# Patient Record
Sex: Female | Born: 1970 | Race: White | Hispanic: No | Marital: Married | Smoking: Former smoker
Health system: Southern US, Community
[De-identification: ages and names within clinical notes are randomized; demographics above are authoritative.]

## PROBLEM LIST (undated history)

## (undated) DIAGNOSIS — Z803 Family history of malignant neoplasm of breast: Secondary | ICD-10-CM

## (undated) DIAGNOSIS — Z800 Family history of malignant neoplasm of digestive organs: Secondary | ICD-10-CM

## (undated) DIAGNOSIS — E079 Disorder of thyroid, unspecified: Secondary | ICD-10-CM

## (undated) DIAGNOSIS — Z808 Family history of malignant neoplasm of other organs or systems: Secondary | ICD-10-CM

## (undated) DIAGNOSIS — C50919 Malignant neoplasm of unspecified site of unspecified female breast: Secondary | ICD-10-CM

## (undated) DIAGNOSIS — I10 Essential (primary) hypertension: Secondary | ICD-10-CM

## (undated) DIAGNOSIS — T7840XA Allergy, unspecified, initial encounter: Secondary | ICD-10-CM

## (undated) DIAGNOSIS — E569 Vitamin deficiency, unspecified: Secondary | ICD-10-CM

## (undated) HISTORY — DX: Disorder of thyroid, unspecified: E07.9

## (undated) HISTORY — DX: Family history of malignant neoplasm of other organs or systems: Z80.8

## (undated) HISTORY — PX: WISDOM TOOTH EXTRACTION: SHX21

## (undated) HISTORY — DX: Allergy, unspecified, initial encounter: T78.40XA

## (undated) HISTORY — DX: Malignant neoplasm of unspecified site of unspecified female breast: C50.919

## (undated) HISTORY — DX: Essential (primary) hypertension: I10

## (undated) HISTORY — DX: Family history of malignant neoplasm of digestive organs: Z80.0

## (undated) HISTORY — DX: Vitamin deficiency, unspecified: E56.9

## (undated) HISTORY — DX: Family history of malignant neoplasm of breast: Z80.3

---

## 2001-03-01 ENCOUNTER — Other Ambulatory Visit: Admission: RE | Admit: 2001-03-01 | Discharge: 2001-03-01 | Payer: Self-pay | Admitting: Obstetrics and Gynecology

## 2001-10-02 ENCOUNTER — Inpatient Hospital Stay (HOSPITAL_COMMUNITY): Admission: AD | Admit: 2001-10-02 | Discharge: 2001-10-04 | Payer: Self-pay | Admitting: Obstetrics and Gynecology

## 2001-11-07 ENCOUNTER — Other Ambulatory Visit: Admission: RE | Admit: 2001-11-07 | Discharge: 2001-11-07 | Payer: Self-pay | Admitting: Obstetrics and Gynecology

## 2003-02-04 ENCOUNTER — Other Ambulatory Visit: Admission: RE | Admit: 2003-02-04 | Discharge: 2003-02-04 | Payer: Self-pay | Admitting: Obstetrics and Gynecology

## 2003-11-18 ENCOUNTER — Inpatient Hospital Stay (HOSPITAL_COMMUNITY): Admission: AD | Admit: 2003-11-18 | Discharge: 2003-11-18 | Payer: Self-pay | Admitting: Obstetrics and Gynecology

## 2003-12-10 ENCOUNTER — Inpatient Hospital Stay (HOSPITAL_COMMUNITY): Admission: AD | Admit: 2003-12-10 | Discharge: 2003-12-12 | Payer: Self-pay | Admitting: Obstetrics and Gynecology

## 2004-03-07 ENCOUNTER — Other Ambulatory Visit: Admission: RE | Admit: 2004-03-07 | Discharge: 2004-03-07 | Payer: Self-pay | Admitting: Obstetrics and Gynecology

## 2004-07-08 ENCOUNTER — Encounter: Admission: RE | Admit: 2004-07-08 | Discharge: 2004-07-08 | Payer: Self-pay | Admitting: *Deleted

## 2005-08-17 ENCOUNTER — Encounter: Admission: RE | Admit: 2005-08-17 | Discharge: 2005-08-17 | Payer: Self-pay | Admitting: Obstetrics and Gynecology

## 2010-10-27 ENCOUNTER — Encounter: Admission: RE | Admit: 2010-10-27 | Discharge: 2010-10-27 | Payer: Self-pay | Admitting: Internal Medicine

## 2010-11-09 ENCOUNTER — Encounter
Admission: RE | Admit: 2010-11-09 | Discharge: 2010-11-09 | Payer: Self-pay | Source: Home / Self Care | Attending: Internal Medicine | Admitting: Internal Medicine

## 2011-04-14 NOTE — H&P (Signed)
NAME:  Crystal Harrison, Crystal Harrison                        ACCOUNT NO.:  [id number]   MEDICAL RECORD NO.:  [id number]                   PATIENT TYPE:  INP   LOCATION:  9163                                 FACILITY:  [facility]   PHYSICIAN:  Lenoard Aden, M.D.             DATE OF BIRTH:  [birth year]   DATE OF ADMISSION:  12/10/2003  DATE OF DISCHARGE:                                HISTORY & PHYSICAL   CHIEF COMPLAINT:  Labor.   HISTORY OF PRESENT ILLNESS:  This is a 40 year old white female, G3, P1, EDD  December 07, 2003, at 40-4/7ths weeks, in active labor.   PAST OBSTETRICAL HISTORY:  1. Past obstetrical0 history is remarkable for SAB times one.  2. SVD in 2002.   ALLERGIES:  No known drug allergies.   MEDICATIONS:  Synthroid, albuterol and prenatal vitamins.   PAST MEDICAL HISTORY:  Past medical history is significant for asthma and  hypothyroidism.   FAMILY HISTORY:  Family history of mitral valve prolapse and heart disease.   PRENATAL LABORATORY DATA:  Blood type B positive, Rh antibody negative.  Rubella immune.  Hepatitis and HIV negative.   PRENATAL COURSE:  The prenatal course was complicated by size-dates  discrepancy with borderline oligohydramnios, otherwise uncomplicated labor  course.   PHYSICAL EXAMINATION:  GENERAL APPEARANCE:  On physical exam she is a well-  developed, well-nourished white female in no acute distress.  HEENT:  Normal.  LUNGS:  Lungs are clear.  HEART:  Regular rhythm.  ABDOMEN:  Abdomen is soft, gravid and nontender.  Estimated fetal weight 7.5  pounds.  PELVIC EXAMINATION:  Cervix is 8 cm, 100%, vertex and +1.  EXTREMITIES:  There are no cords.  NEUROLOGIC:  Neurologic exam is nonfocal.   IMPRESSION:  Term intrauterine pregnancy in active labor.   PLAN:  Artificial rupture of membranes and anticipate vaginal delivery.                                               Lenoard Aden, M.D.    RJT/MEDQ  D:  12/11/2003  T:  12/11/2003  Job:   [id number]

## 2011-04-14 NOTE — H&P (Signed)
[facility]  Patient:    Crystal Harrison, Crystal Harrison Visit Number: [id number] MRN: [id number]          Service Type: OBS Location: 910A [id number] [id number] Attending Physician:  Esmeralda Arthur Dictated by:   Silverio Lay, M.D. Admit Date:  10/02/2001                           History and Physical  REASON FOR ADMISSION:         Intrauterine pregnancy at 39 weeks and 6 days in active labor.  HISTORY OF PRESENT ILLNESS:   This is a 40 year old married white female, gravida 2, para 0, aborta 1, with a due date of October 03, 2001, by ultrasound, who presented in the office reporting irregular uterine contractions all morning, but of intensity increasing.  Since 2 p.m. this afternoon, contractions have been every three to five minutes, intensity 5 on 10, and she reports some bloody show, but no watery discharge.  She reports good fetal activity and denies any pregnancy-induced hypertension.  Prenatal course revealed blood type B-positive.  RPR nonreactive.  Rubella immune.  HBsAg negative.  HIV nonreactive.  Pap smear within normal limits. Gonorrhea negative.  Chlamydia negative.  First trimester ultrasound provided [place] with a due date of October 03, 2001.  A 16 week AFP was within normal limits.  A 21 week ultrasound revealed a normal anatomy survey with an anterior placenta, normal cervical length.  Ultrasound at 23 weeks for second trimester bleeding revealed a normal placenta.  A 28 week glucose tolerance test was within normal limits.  A 28 week ultrasound revealed an average for gestational age with normal amniotic fluid index.  A 35 week group B strep was negative.  Her prenatal course was otherwise remarkable for first trimester diagnosis of goiter which was seen in consultation by Dr. Criss Alvine, and the patient was found to have Graves disease.  She is currently followed by Dr. Evlyn Kanner, and is currently using PTU 50 mg alternating with 25 mg every day. She was seen at  [facility] for consultation with no particular recommendation, but to follow fetal growth.  Her prenatal course was otherwise uneventful.  ALLERGIES:                    No known drug allergies.  PAST MEDICAL HISTORY:         December 2001, spontaneous miscarriage with no complications.  FAMILY HISTORY:               Father with coronary artery disease.  Mother with arrhythmia.  SOCIAL HISTORY:               Married, nonsmoker, is a Runner, broadcasting/film/video.  PHYSICAL EXAMINATION:  GENERAL:                      No acute distress.  VITAL SIGNS:                  Normal.  HEENT:                        Negative.  LUNGS:                        Clear.  HEART:  Normal.  ABDOMEN:                      Gravid, nontender, vertex presentation.  VAGINAL EXAMINATION:          Four to five centimeters, completely effaced, bulging bag of membranes.  EXTREMITIES:                  Negative.  LABORATORY DATA:              Fetal heart rate tracing reactive.  ASSESSMENT:                   1. Intrauterine pregnancy at 39 weeks and 6 days                                  in active labor.                               2. Graves disease, well-controlled, followed by                                  Dr. Evlyn Kanner.  Currently on PTU 50/25 mg                                  alternating.  PLAN:                         The patient will be admitted to labor and delivery.  Spontaneous vaginal delivery is expected. Dictated by:   Silverio Lay, M.D. Attending Physician:  Esmeralda Arthur DD:  10/02/01 TD:  10/02/01 Job: [id number]

## 2011-10-23 ENCOUNTER — Other Ambulatory Visit: Payer: Self-pay | Admitting: Obstetrics and Gynecology

## 2011-10-23 DIAGNOSIS — Z1231 Encounter for screening mammogram for malignant neoplasm of breast: Secondary | ICD-10-CM

## 2011-11-24 ENCOUNTER — Ambulatory Visit: Payer: Self-pay

## 2011-12-14 ENCOUNTER — Ambulatory Visit
Admission: RE | Admit: 2011-12-14 | Discharge: 2011-12-14 | Disposition: A | Discharge status: Home / Self Care | Payer: BC Managed Care – PPO | Source: Ambulatory Visit | Attending: Obstetrics and Gynecology | Admitting: Obstetrics and Gynecology

## 2011-12-14 DIAGNOSIS — Z1231 Encounter for screening mammogram for malignant neoplasm of breast: Secondary | ICD-10-CM

## 2012-07-23 ENCOUNTER — Encounter (INDEPENDENT_AMBULATORY_CARE_PROVIDER_SITE_OTHER): Payer: Self-pay | Admitting: General Surgery

## 2012-07-24 ENCOUNTER — Ambulatory Visit (INDEPENDENT_AMBULATORY_CARE_PROVIDER_SITE_OTHER): Payer: BC Managed Care – PPO | Admitting: General Surgery

## 2012-07-24 ENCOUNTER — Encounter (INDEPENDENT_AMBULATORY_CARE_PROVIDER_SITE_OTHER): Payer: Self-pay | Admitting: General Surgery

## 2012-07-24 VITALS — BP 108/68 | HR 64 | Temp 97.6°F | Resp 16 | Ht 66.5 in | Wt 147.8 lb

## 2012-07-24 DIAGNOSIS — K644 Residual hemorrhoidal skin tags: Secondary | ICD-10-CM

## 2012-07-24 NOTE — Addendum Note (Signed)
Addended by: Liz Malady on: 07/24/2012 10:47 AM   Modules accepted: Orders

## 2012-07-24 NOTE — Progress Notes (Signed)
Patient ID: Crystal Harrison, female   DOB: 1971-09-05, 41 y.o.   MRN: [id number]  Chief Complaint  Patient presents with  . Other    new pt- eval skin tag    HPI Crystal Harrison is a 41 y.o. female.   HPIPatient presents for evaluation of anal skin tag. I was asked to see her in consultation by Dr. Donette Larry. She has had hemorrhoid problems for the past 12 months. They have improved significantly with changes in her diet. She did, however, noticed a polyp or a skin tag in her anal area after resolution of her hemorrhoid symptoms. This was noted by her gynecologist in followup up with an examination by her primary care physician. She is here for consideration for removal. She's had no localized pain since her hemorrhoid symptoms resolved.  Past Medical History  Diagnosis Date  . Hypertension   . Thyroid disease   . Allergy   . Vitamin deficiency     History reviewed. No pertinent past surgical history.  Family History  Problem Relation Age of Onset  . Heart disease Mother   . Heart disease Father   . Cancer Maternal Aunt     breast  . Cancer Paternal Aunt     thyroid  . Cancer Paternal Grandmother     stomach    Social History History  Substance Use Topics  . Smoking status: Former Smoker    Quit date: 07/24/2003  . Smokeless tobacco: Not on file  . Alcohol Use: Yes    No Known Allergies  Current Outpatient Prescriptions  Medication Sig Dispense Refill  . albuterol (PROVENTIL HFA;VENTOLIN HFA) 108 (90 BASE) MCG/ACT inhaler Inhale 2 puffs into the lungs every 6 (six) hours as needed.      . cholecalciferol (VITAMIN D) 1000 UNITS tablet Take 1,000 Units by mouth daily.      . hydrochlorothiazide (HYDRODIURIL) 25 MG tablet Take 25 mg by mouth daily.      Marland Kitchen levothyroxine (SYNTHROID, LEVOTHROID) 75 MCG tablet Take 75 mcg by mouth daily.      . montelukast (SINGULAIR) 10 MG tablet Take 10 mg by mouth as needed.       . ramipril (ALTACE) 5 MG tablet Take 5 mg by mouth daily.         Review of Systems Review of Systems  Constitutional: Negative for fever, chills and unexpected weight change.  HENT: Negative for hearing loss, congestion, sore throat, trouble swallowing and voice change.   Eyes: Negative for visual disturbance.  Respiratory: Negative for cough and wheezing.   Cardiovascular: Negative for chest pain, palpitations and leg swelling.  Gastrointestinal: Negative for nausea, vomiting, abdominal pain, diarrhea, constipation, blood in stool, abdominal distention and anal bleeding.       See history of present illness  Genitourinary: Negative for hematuria, vaginal bleeding and difficulty urinating.  Musculoskeletal: Negative for arthralgias.  Skin: Negative for rash and wound.  Neurological: Negative for seizures, syncope and headaches.  Hematological: Negative for adenopathy. Does not bruise/bleed easily.  Psychiatric/Behavioral: Negative for confusion.    Blood pressure 108/68, pulse 64, temperature 97.6 F (36.4 C), temperature source Temporal, resp. rate 16, height 5' 6.5" (1.689 m), weight 147 lb 12.8 oz (67.042 kg).  Physical Exam Physical Exam  Constitutional: She is oriented to person, place, and time. She appears well-developed and well-nourished. No distress.  HENT:  Head: Normocephalic and atraumatic.  Eyes: Conjunctivae and EOM are normal. Pupils are equal, round, and reactive to light.  Neck:  Normal range of motion. Neck supple. No tracheal deviation present. No thyromegaly present.  Cardiovascular: Normal rate, regular rhythm, normal heart sounds and intact distal pulses.   Pulmonary/Chest: Effort normal and breath sounds normal. No stridor. No respiratory distress. She has no wheezes. She has no rales.  Abdominal: Soft. Bowel sounds are normal. She exhibits no distension. There is no tenderness. There is no rebound and no guarding.       External anal exam reveals a large skin tag in the posterior right side. It is pedunculated. Digital  rectal exam reveals no masses. There is no evidence of infection.  Musculoskeletal: Normal range of motion.  Neurological: She is alert and oriented to person, place, and time.  Skin: Skin is warm.    Data Reviewed   Assessment    External anal skin tag    Plan    This is very symptomatic to her. It bothers her at times and causes her discomfort with hygiene. I have offered to remove it. Procedure of excision anal skin tag was described in detail. We discussed the risks and benefits. She is agreeable. She will check her schedule and call back to pick a time.       Sheryl Saintil E 07/24/2012, 10:40 AM

## 2012-11-28 ENCOUNTER — Other Ambulatory Visit: Payer: Self-pay | Admitting: Obstetrics and Gynecology

## 2012-11-28 DIAGNOSIS — Z1231 Encounter for screening mammogram for malignant neoplasm of breast: Secondary | ICD-10-CM

## 2012-12-30 ENCOUNTER — Ambulatory Visit
Admission: RE | Admit: 2012-12-30 | Discharge: 2012-12-30 | Disposition: A | Discharge status: Home / Self Care | Payer: BC Managed Care – PPO | Source: Ambulatory Visit | Attending: Obstetrics and Gynecology | Admitting: Obstetrics and Gynecology

## 2012-12-30 DIAGNOSIS — Z1231 Encounter for screening mammogram for malignant neoplasm of breast: Secondary | ICD-10-CM

## 2013-12-15 ENCOUNTER — Other Ambulatory Visit: Payer: Self-pay

## 2013-12-15 DIAGNOSIS — Z1231 Encounter for screening mammogram for malignant neoplasm of breast: Secondary | ICD-10-CM

## 2014-01-06 ENCOUNTER — Ambulatory Visit
Admission: RE | Admit: 2014-01-06 | Discharge: 2014-01-06 | Disposition: A | Discharge status: Home / Self Care | Payer: BC Managed Care – PPO | Source: Ambulatory Visit

## 2014-01-06 DIAGNOSIS — Z1231 Encounter for screening mammogram for malignant neoplasm of breast: Secondary | ICD-10-CM

## 2015-07-13 ENCOUNTER — Other Ambulatory Visit: Payer: Self-pay

## 2015-07-13 DIAGNOSIS — Z1231 Encounter for screening mammogram for malignant neoplasm of breast: Secondary | ICD-10-CM

## 2015-08-23 ENCOUNTER — Ambulatory Visit
Admission: RE | Admit: 2015-08-23 | Discharge: 2015-08-23 | Disposition: A | Discharge status: Home / Self Care | Payer: Self-pay | Source: Ambulatory Visit

## 2015-08-23 DIAGNOSIS — Z1231 Encounter for screening mammogram for malignant neoplasm of breast: Secondary | ICD-10-CM

## 2015-08-26 ENCOUNTER — Other Ambulatory Visit: Payer: Self-pay | Admitting: Obstetrics and Gynecology

## 2015-08-26 DIAGNOSIS — R928 Other abnormal and inconclusive findings on diagnostic imaging of breast: Secondary | ICD-10-CM

## 2015-08-31 ENCOUNTER — Other Ambulatory Visit: Payer: Self-pay | Admitting: Internal Medicine

## 2015-08-31 ENCOUNTER — Other Ambulatory Visit: Payer: Self-pay | Admitting: General Surgery

## 2015-08-31 DIAGNOSIS — R928 Other abnormal and inconclusive findings on diagnostic imaging of breast: Secondary | ICD-10-CM

## 2015-09-01 ENCOUNTER — Ambulatory Visit
Admission: RE | Admit: 2015-09-01 | Discharge: 2015-09-01 | Disposition: A | Discharge status: Home / Self Care | Payer: BLUE CROSS/BLUE SHIELD | Source: Ambulatory Visit | Attending: Obstetrics and Gynecology | Admitting: Obstetrics and Gynecology

## 2015-09-01 DIAGNOSIS — R928 Other abnormal and inconclusive findings on diagnostic imaging of breast: Secondary | ICD-10-CM

## 2016-03-01 DIAGNOSIS — D259 Leiomyoma of uterus, unspecified: Secondary | ICD-10-CM | POA: Diagnosis not present

## 2016-03-01 DIAGNOSIS — Z30432 Encounter for removal of intrauterine contraceptive device: Secondary | ICD-10-CM | POA: Diagnosis not present

## 2016-12-20 ENCOUNTER — Other Ambulatory Visit: Payer: Self-pay | Admitting: Internal Medicine

## 2016-12-20 DIAGNOSIS — E559 Vitamin D deficiency, unspecified: Secondary | ICD-10-CM | POA: Diagnosis not present

## 2016-12-20 DIAGNOSIS — Z1231 Encounter for screening mammogram for malignant neoplasm of breast: Secondary | ICD-10-CM

## 2016-12-20 DIAGNOSIS — Z23 Encounter for immunization: Secondary | ICD-10-CM | POA: Diagnosis not present

## 2016-12-20 DIAGNOSIS — Z0000 Encounter for general adult medical examination without abnormal findings: Secondary | ICD-10-CM | POA: Diagnosis not present

## 2016-12-20 DIAGNOSIS — E039 Hypothyroidism, unspecified: Secondary | ICD-10-CM | POA: Diagnosis not present

## 2016-12-20 DIAGNOSIS — Z1389 Encounter for screening for other disorder: Secondary | ICD-10-CM | POA: Diagnosis not present

## 2017-01-15 ENCOUNTER — Ambulatory Visit
Admission: RE | Admit: 2017-01-15 | Discharge: 2017-01-15 | Disposition: A | Discharge status: Home / Self Care | Payer: BLUE CROSS/BLUE SHIELD | Source: Ambulatory Visit | Attending: Internal Medicine | Admitting: Internal Medicine

## 2017-01-15 DIAGNOSIS — Z1231 Encounter for screening mammogram for malignant neoplasm of breast: Secondary | ICD-10-CM | POA: Diagnosis not present

## 2017-02-01 DIAGNOSIS — E038 Other specified hypothyroidism: Secondary | ICD-10-CM | POA: Diagnosis not present

## 2018-01-21 DIAGNOSIS — Z0000 Encounter for general adult medical examination without abnormal findings: Secondary | ICD-10-CM | POA: Diagnosis not present

## 2018-01-21 DIAGNOSIS — E559 Vitamin D deficiency, unspecified: Secondary | ICD-10-CM | POA: Diagnosis not present

## 2018-01-21 DIAGNOSIS — Z1389 Encounter for screening for other disorder: Secondary | ICD-10-CM | POA: Diagnosis not present

## 2018-01-21 DIAGNOSIS — E039 Hypothyroidism, unspecified: Secondary | ICD-10-CM | POA: Diagnosis not present

## 2018-01-21 DIAGNOSIS — Z136 Encounter for screening for cardiovascular disorders: Secondary | ICD-10-CM | POA: Diagnosis not present

## 2018-03-01 ENCOUNTER — Other Ambulatory Visit: Payer: Self-pay | Admitting: Internal Medicine

## 2018-03-01 DIAGNOSIS — Z1231 Encounter for screening mammogram for malignant neoplasm of breast: Secondary | ICD-10-CM

## 2018-03-26 ENCOUNTER — Ambulatory Visit
Admission: RE | Admit: 2018-03-26 | Discharge: 2018-03-26 | Disposition: A | Discharge status: Home / Self Care | Payer: BLUE CROSS/BLUE SHIELD | Source: Ambulatory Visit | Attending: Internal Medicine | Admitting: Internal Medicine

## 2018-03-26 DIAGNOSIS — Z1231 Encounter for screening mammogram for malignant neoplasm of breast: Secondary | ICD-10-CM

## 2018-03-28 ENCOUNTER — Other Ambulatory Visit: Payer: Self-pay | Admitting: Internal Medicine

## 2018-03-28 DIAGNOSIS — R928 Other abnormal and inconclusive findings on diagnostic imaging of breast: Secondary | ICD-10-CM

## 2018-04-01 ENCOUNTER — Ambulatory Visit
Admission: RE | Admit: 2018-04-01 | Discharge: 2018-04-01 | Disposition: A | Discharge status: Home / Self Care | Payer: BLUE CROSS/BLUE SHIELD | Source: Ambulatory Visit | Attending: Internal Medicine | Admitting: Internal Medicine

## 2018-04-01 ENCOUNTER — Other Ambulatory Visit: Payer: Self-pay | Admitting: Internal Medicine

## 2018-04-01 DIAGNOSIS — R921 Mammographic calcification found on diagnostic imaging of breast: Secondary | ICD-10-CM | POA: Diagnosis not present

## 2018-04-01 DIAGNOSIS — R928 Other abnormal and inconclusive findings on diagnostic imaging of breast: Secondary | ICD-10-CM

## 2018-04-05 ENCOUNTER — Ambulatory Visit
Admission: RE | Admit: 2018-04-05 | Discharge: 2018-04-05 | Disposition: A | Discharge status: Home / Self Care | Payer: BLUE CROSS/BLUE SHIELD | Source: Ambulatory Visit | Attending: Internal Medicine | Admitting: Internal Medicine

## 2018-04-05 DIAGNOSIS — R921 Mammographic calcification found on diagnostic imaging of breast: Secondary | ICD-10-CM

## 2018-04-05 DIAGNOSIS — D0511 Intraductal carcinoma in situ of right breast: Secondary | ICD-10-CM | POA: Diagnosis not present

## 2018-04-11 ENCOUNTER — Other Ambulatory Visit: Payer: Self-pay | Admitting: Surgery

## 2018-04-11 DIAGNOSIS — D0511 Intraductal carcinoma in situ of right breast: Secondary | ICD-10-CM

## 2018-04-13 ENCOUNTER — Other Ambulatory Visit: Payer: Self-pay | Admitting: Surgery

## 2018-04-13 DIAGNOSIS — D493 Neoplasm of unspecified behavior of breast: Secondary | ICD-10-CM

## 2018-04-15 ENCOUNTER — Telehealth: Payer: Self-pay | Admitting: Oncology

## 2018-04-15 ENCOUNTER — Encounter: Payer: Self-pay | Admitting: Radiation Oncology

## 2018-04-15 DIAGNOSIS — R921 Mammographic calcification found on diagnostic imaging of breast: Secondary | ICD-10-CM | POA: Diagnosis not present

## 2018-04-15 DIAGNOSIS — N6489 Other specified disorders of breast: Secondary | ICD-10-CM | POA: Diagnosis not present

## 2018-04-15 DIAGNOSIS — D0511 Intraductal carcinoma in situ of right breast: Secondary | ICD-10-CM | POA: Diagnosis not present

## 2018-04-15 DIAGNOSIS — D0591 Unspecified type of carcinoma in situ of right breast: Secondary | ICD-10-CM | POA: Diagnosis not present

## 2018-04-15 NOTE — Telephone Encounter (Signed)
Pt has been scheduled to see Dr. Jana Hakim on 5/23 at 4pm. Pt aware to arrive 30 minutes for labs. Ms.Nghiem has been scheduled to see Roma Kayser on 5/22 at 11am for genetic counseling as well.

## 2018-04-16 DIAGNOSIS — D0511 Intraductal carcinoma in situ of right breast: Secondary | ICD-10-CM | POA: Diagnosis not present

## 2018-04-17 ENCOUNTER — Encounter: Payer: Self-pay | Admitting: Genetics

## 2018-04-17 ENCOUNTER — Inpatient Hospital Stay: Payer: BLUE CROSS/BLUE SHIELD

## 2018-04-17 ENCOUNTER — Other Ambulatory Visit: Payer: Self-pay

## 2018-04-17 ENCOUNTER — Inpatient Hospital Stay: Payer: BLUE CROSS/BLUE SHIELD | Attending: Genetic Counselor | Admitting: Genetics

## 2018-04-17 DIAGNOSIS — Z170 Estrogen receptor positive status [ER+]: Principal | ICD-10-CM

## 2018-04-17 DIAGNOSIS — Z800 Family history of malignant neoplasm of digestive organs: Secondary | ICD-10-CM | POA: Insufficient documentation

## 2018-04-17 DIAGNOSIS — C50511 Malignant neoplasm of lower-outer quadrant of right female breast: Secondary | ICD-10-CM

## 2018-04-17 DIAGNOSIS — Z803 Family history of malignant neoplasm of breast: Secondary | ICD-10-CM | POA: Insufficient documentation

## 2018-04-17 DIAGNOSIS — Z808 Family history of malignant neoplasm of other organs or systems: Secondary | ICD-10-CM | POA: Insufficient documentation

## 2018-04-17 DIAGNOSIS — C50911 Malignant neoplasm of unspecified site of right female breast: Secondary | ICD-10-CM

## 2018-04-17 DIAGNOSIS — C50919 Malignant neoplasm of unspecified site of unspecified female breast: Secondary | ICD-10-CM

## 2018-04-17 DIAGNOSIS — D0511 Intraductal carcinoma in situ of right breast: Secondary | ICD-10-CM | POA: Insufficient documentation

## 2018-04-17 HISTORY — DX: Malignant neoplasm of unspecified site of unspecified female breast: C50.919

## 2018-04-17 NOTE — Progress Notes (Signed)
[facility]  [phone] Fax:([phone]) [phone]     ID: EVERLINA GOTTS DOB: Sep 08, 1971  MR#: [id number]  UYE#:[id number]  Patient Care Team: Wenda Low, MD as PCP - General (Internal Medicine) Jamelyn Bovard, Virgie Dad, MD as Consulting Physician (Oncology) Coralie Keens, MD as Consulting Physician (General Surgery) Kyung Rudd, MD as Consulting Physician (Radiation Oncology) Servando Salina, MD as Consulting Physician (Obstetrics and Gynecology) OTHER MD:  CHIEF COMPLAINT: Ductal carcinoma in situ of the right breast  CURRENT TREATMENT: Awaiting definitive surgery   HISTORY OF CURRENT ILLNESS: JAZMYN OFFNER had routine screening mammography on 03/26/2018 at the breast center showing a possible abnormality in the right breast.  The breast density was category C.  She underwent right sided diagnostic mammography with tomography at [facility] on 04/01/2018 showing heterogeneous calcifications measuring 1 cm in the upper inner right breast, with no associated mass..  Accordingly on 04/05/2018 she proceeded to biopsy of the right breast area (12:30 o'clock) in question. 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  The patient's subsequent history is as detailed below.  INTERVAL HISTORY: Shukri was evaluated in the breast cancer clinic on 04/18/2018 accompanied by her husband, Olen Cordial. Her case was also presented at the multidisciplinary breast cancer conference 04/17/2018. At that time a preliminary plan was proposed: Breast MRI (scheduled for 04/27/2018), genetics counseling (performed earlier today l.   REVIEW OF SYSTEMS: There were no specific symptoms leading to the original mammogram, which was routinely scheduled. The patient denies unusual headaches, visual changes, nausea,  vomiting, stiff neck, dizziness, or gait imbalance. There has been no cough, phlegm production, or pleurisy, no chest pain or pressure, and no change in bowel or bladder habits. The patient denies fever, rash, bleeding, unexplained fatigue or unexplained weight loss. A detailed review of systems was otherwise entirely negative.  PAST MEDICAL HISTORY: Past Medical History:  Diagnosis Date  . Allergy   . Breast cancer ([place]) 04/17/2018  . Family history of breast cancer   . Family history of skin cancer   . Family history of stomach cancer   . Family history of thyroid cancer   . Hypertension   . Thyroid disease   . Vitamin deficiency     PAST SURGICAL HISTORY: No past surgical history on file.  FAMILY HISTORY Family History  Problem Relation Age of Onset  . Heart disease Mother   . Heart disease Father   . Cancer Maternal Aunt        breast  . Cancer Paternal Aunt        thyroid  . Cancer Paternal Grandmother        stomach   As of May 2019, the patient's father is 69 years old. The patient's mother is 72 years old.  She mother was diagnosed with breast cancer about 2-3 years ago.  The patient has 0 brothers and 2 sisters. One of her sisters has breast cancer and it has metastasized to her liver. She was 49 years old when she was diagnosed.  She is being treated at [facility]. The patient's maternal aunt had "stomach cancer ".  A paternal aunt had thyroid cancer and the paternal grandmother had breast cancer.  Note that the patient's mother and sister with a history of breast cancer have both been genetically tested and they both did not carry a deleterious gene  GYNECOLOGIC HISTORY:  Last menstrual period: 03/27/2018 Menarche: 47 years old Age at first live birth: 17 years old [place] P 2 LMP 03/27/2018, regular, last about 7-10 days, 2 heavy days Contraceptive, oral and IUD in the past. 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 Her husband, Olen Cordial, is in Press photographer with [employer], selling to [employer]. She has two daughters, Lonn [place], 68 years old and 27, 49 years old. She "grew up in" our [place] but is currently not on a tender.     ADVANCED DIRECTIVES:    HEALTH MAINTENANCE: Social History   Tobacco Use  . Smoking status: Former Smoker    Last attempt to quit: 07/24/2003    Years since quitting: 14.7  Substance Use Topics  . Alcohol use: Yes  . Drug use: No     Colonoscopy: Never  PAP:   Bone density: Never   No Known Allergies  Current Outpatient Medications  Medication Sig Dispense Refill  . cholecalciferol (VITAMIN D) 1000 UNITS tablet Take 1,000 Units by mouth daily.    . hydrochlorothiazide (HYDRODIURIL) 25 MG tablet Take 25 mg by mouth daily.    . IRON, FERROUS SULFATE, PO Take 65 mg by mouth 1 day or 1 dose.    . levothyroxine (SYNTHROID, LEVOTHROID) 75 MCG tablet Take 75 mcg by mouth daily.    . montelukast (SINGULAIR) 10 MG tablet Take 10 mg by mouth as needed.     . ramipril (ALTACE) 5 MG tablet Take 5 mg by mouth daily.    Marland Kitchen albuterol (PROVENTIL HFA;VENTOLIN HFA) 108 (90 BASE) MCG/ACT inhaler Inhale 2 puffs into the lungs every 6 (six) hours as needed.     No current facility-administered medications for this visit.     OBJECTIVE: Young white woman who appears well  Vitals:   04/18/18 1550  BP: 137/74  Pulse: (!) 51  Resp: 18  Temp: 98.5 F (36.9 C)  SpO2: 100%     Body mass index is 26.52 kg/m.   Wt Readings from Last 3 Encounters:  04/18/18 166 lb 12.8 oz (75.7 kg)  07/24/12 147 lb 12.8 oz (67 kg)      ECOG FS:0 - Asymptomatic  Ocular: Sclerae unicteric, pupils round and equal Ear-nose-throat: Oropharynx clear and moist Lymphatic: No cervical or supraclavicular adenopathy Lungs no rales or rhonchi Heart regular rate and rhythm Abd soft, nontender, positive bowel sounds MSK no focal spinal tenderness, no joint edema Neuro:  non-focal, well-oriented, appropriate affect Breasts: The right breast is status post recent biopsy.  There is no palpable mass.  Left breast is benign.  Both axillae are benign.   LAB RESULTS:  CMP  No results found for: NA, K, CL, CO2, GLUCOSE, BUN, CREATININE, CALCIUM, PROT, ALBUMIN, AST, ALT, ALKPHOS, BILITOT, GFRNONAA, GFRAA  No results found for: TOTALPROTELP, ALBUMINELP, A1GS, A2GS, BETS, BETA2SER, GAMS, MSPIKE, SPEI  No results found for: KPAFRELGTCHN, LAMBDASER, [facility]  Lab Results  Component Value Date   WBC 7.1 04/18/2018   NEUTROABS 3.8 04/18/2018   HGB 13.7 04/18/2018   HCT 41.0 04/18/2018   MCV 85.4 04/18/2018   PLT 244 04/18/2018    _0 @  No results found for: LABCA2  No components found for: [id number]  No results for input(s): INR in the last 168 hours.  No results found for: LABCA2  No results found for: TFT732  No results found for: KGU542  No  results found for: DJM426  No results found for: CA2729  No components found for: HGQUANT  No results found for: CEA1 / No results found for: CEA1   No results found for: AFPTUMOR  No results found for: [place]  No results found for: PSA1  Appointment on 04/18/2018  Component Date Value Ref Range Status  . WBC Count 04/18/2018 7.1  3.9 - 10.3 K/uL Final  . RBC 04/18/2018 4.80  3.70 - 5.45 MIL/uL Final  . Hemoglobin 04/18/2018 13.7  11.6 - 15.9 g/dL Final  . HCT 04/18/2018 41.0  34.8 - 46.6 % Final  . MCV 04/18/2018 85.4  79.5 - 101.0 fL Final  . MCH 04/18/2018 28.5  25.1 - 34.0 pg Final  . MCHC 04/18/2018 33.4  31.5 - 36.0 g/dL Final  . RDW 04/18/2018 17.6* 11.2 - 14.5 % Final  . Platelet Count 04/18/2018 244  145 - 400 K/uL Final  . Neutrophils Relative % 04/18/2018 54  % Final  . Neutro Abs 04/18/2018 3.8  1.5 - 6.5 K/uL Final  . Lymphocytes Relative 04/18/2018 32  % Final  . Lymphs Abs 04/18/2018 2.3  0.9 - 3.3 K/uL Final  . Monocytes Relative 04/18/2018 6  % Final  .  Monocytes Absolute 04/18/2018 0.4  0.1 - 0.9 K/uL Final  . Eosinophils Relative 04/18/2018 8  % Final  . Eosinophils Absolute 04/18/2018 0.6* 0.0 - 0.5 K/uL Final  . Basophils Relative 04/18/2018 0  % Final  . Basophils Absolute 04/18/2018 0.0  0.0 - 0.1 K/uL Final   Performed at [facility], [place] [address], [place], [place] [address]    (this displays the last labs from the last 3 days)  No results found for: TOTALPROTELP, ALBUMINELP, A1GS, A2GS, BETS, BETA2SER, GAMS, MSPIKE, SPEI (this displays SPEP labs)  No results found for: KPAFRELGTCHN, LAMBDASER, KAPLAMBRATIO (kappa/lambda light chains)  No results found for: HGBA, HGBA2QUANT, HGBFQUANT, HGBSQUAN (Hemoglobinopathy evaluation)   No results found for: LDH  No results found for: IRON, TIBC, IRONPCTSAT (Iron and TIBC)  No results found for: FERRITIN  Urinalysis No results found for: COLORURINE, APPEARANCEUR, LABSPEC, PHURINE, GLUCOSEU, HGBUR, BILIRUBINUR, KETONESUR, PROTEINUR, UROBILINOGEN, NITRITE, LEUKOCYTESUR   STUDIES: Mm Digital Diagnostic Unilat R  Result Date: 04/01/2018 CLINICAL DATA:  47 year old female for evaluation of new RIGHT breast calcifications identified on screening mammogram. Strong family history of breast cancer with mother and sister diagnosed. EXAM: DIGITAL DIAGNOSTIC RIGHT MAMMOGRAM WITH CAD COMPARISON:  Previous exam(s). ACR Breast Density Category c: The breast tissue is heterogeneously dense, which may obscure small masses. FINDINGS: Full field LATERAL and magnification views of the RIGHT breast demonstrate a 1 cm group of slightly heterogeneous calcifications within the UPPER slightly INNER RIGHT breast. No associated mass identified. Mammographic images were processed with CAD. IMPRESSION: 1 cm group of indeterminate calcifications within the UPPER slightly INNER RIGHT breast. Tissue sampling recommended. RECOMMENDATION: Stereotactic guided RIGHT breast biopsy, which will be  arranged. I have discussed the findings and recommendations with the patient. Results were also provided in writing at the conclusion of the visit. If applicable, a reminder letter will be sent to the patient regarding the next appointment. BI-RADS CATEGORY  4: Suspicious. Electronically Signed   By: Margarette Canada M.D.   On: 04/01/2018 13:27   Mm 3d Screen Breast Bilateral  Result Date: 03/27/2018 CLINICAL DATA:  Screening. EXAM: DIGITAL SCREENING BILATERAL MAMMOGRAM WITH TOMO AND CAD COMPARISON:  Previous exam(s). ACR Breast Density Category c: The breast tissue is heterogeneously  dense, which may obscure small masses. FINDINGS: In the right breast, calcifications warrant further evaluation with magnified views. In the left breast, no findings suspicious for malignancy. Images were processed with CAD. IMPRESSION: Further evaluation is suggested for calcifications in the right breast. RECOMMENDATION: Diagnostic mammogram of the right breast. (Code:[id number]) The patient will be contacted regarding the findings, and additional imaging will be scheduled. BI-RADS CATEGORY  0: Incomplete. Need additional imaging evaluation and/or prior mammograms for comparison. Electronically Signed   By: Abelardo Diesel M.D.   On: 03/27/2018 10:30   Mm Clip Placement Right  Result Date: 04/05/2018 CLINICAL DATA:  Status post stereotactic core needle biopsy of right breast calcifications. EXAM: DIAGNOSTIC RIGHT MAMMOGRAM POST STEREOTACTIC BIOPSY COMPARISON:  Previous exam(s). FINDINGS: Mammographic images were obtained following stereotactic guided biopsy of right breast calcifications. The biopsy clip lies adjacent to a few residual calcifications in the posterior, upper and slightly medial aspect of the right breast. IMPRESSION: Well-positioned coil shaped biopsy clip following stereotactic core needle biopsy of right breast calcifications. Final Assessment: Post Procedure Mammograms for Marker Placement Electronically Signed   By:  Lajean Manes M.D.   On: 04/05/2018 12:00   Mm Rt Breast Bx W Loc Dev 1st Lesion Image Bx Spec Stereo Guide  Addendum Date: 04/08/2018   ADDENDUM REPORT: 04/08/2018 14:31 ADDENDUM: Pathology revealed HIGH GRADE DUCTAL CARCINOMA IN SITU WITH CALCIFICATIONS of RIGHT breast, 12:30 o'clock. This was found to be concordant by Dr. Lajean Manes. Pathology results were discussed with the patient by telephone. The patient reported doing well after the biopsy with tenderness at the site. Post biopsy instructions and care were reviewed and questions were answered. The patient was encouraged to call [facility] for any additional concerns. Surgical consultation has been arranged with Dr. Coralie Keens at [facility] on Apr 11, 2018. Pathology results reported by Roselind Messier, RN on 04/08/2018. Electronically Signed   By: Lajean Manes M.D.   On: 04/08/2018 14:31   Result Date: 04/08/2018 CLINICAL DATA:  Patient presents for stereotactic core needle biopsy of right breast calcifications. EXAM: RIGHT BREAST STEREOTACTIC CORE NEEDLE BIOPSY COMPARISON:  Previous exams. FINDINGS: The patient and I discussed the procedure of stereotactic-guided biopsy including benefits and alternatives. We discussed the high likelihood of a successful procedure. We discussed the risks of the procedure including infection, bleeding, tissue injury, clip migration, and inadequate sampling. Informed written consent was given. The usual time out protocol was performed immediately prior to the procedure. Using sterile technique and 1% Lidocaine as local anesthetic, under stereotactic guidance, a 9 gauge vacuum assisted device was used to perform core needle biopsy of calcifications in the upper, slightly inner quadrant the right breast, at 12:30 o'clock, using a superior approach. Specimen radiograph was performed showing calcifications for which biopsy was performed. Specimens with calcifications are  identified for pathology. Lesion quadrant: Upper inner quadrant At the conclusion of the procedure, a coil shaped tissue marker clip was deployed into the biopsy cavity. Follow-up 2-view mammogram was performed and dictated separately. IMPRESSION: Stereotactic-guided biopsy of right breast calcifications. No apparent complications. Electronically Signed: By: Lajean Manes M.D. On: 04/05/2018 11:51    ELIGIBLE FOR AVAILABLE RESEARCH PROTOCOL: no  ASSESSMENT: 47 y.o. [place] woman status post right breast upper inner quadrant biopsy 04/01/2018 for ductal carcinoma in situ, high-grade, the area of suspicious calcifications measuring approximately 1 cm, weakly estrogen receptor positive, progesterone receptor negative.  (1) genetics testing 04/18/2018  (2) definitive surgery pending  (3)  adjuvant radiation as appropriate  (4) antiestrogens to be considered to the completion of local treatment.    PLAN: We spent the better part of today's hour-long appointment discussing the biology of her diagnosis and the specifics of her situation. Arnette understands that in noninvasive ductal carcinoma, also called ductal carcinoma in situ ("DCIS") the breast cancer cells remain trapped in the ducts were they started. They cannot travel to a vital organ. For that reason these cancers in themselves are not life-threatening.  If the whole breast is removed then all the ducts are removed and since the cancer cells are trapped in the ducts, the cure rate with mastectomy for noninvasive breast cancer is approximately 99%. Nevertheless we recommend lumpectomy, because there is no survival advantage to mastectomy and because the cosmetic result is generally superior with breast conservation.  Since the patient is keeping her breasts, there will be some risk of recurrence. The recurrence can only be in the same breast since, again, the cells are trapped in the ducts. There is no connection from one breast to the  other. The risk of local recurrence is cut by more than half with radiation, which is standard in this situation.  In estrogen receptor positive cancers anti-estrogens can also be considered. They will further reduce the risk of recurrence by one half. In addition anti-estrogens will lower the risk of a new breast cancer developing in either breast, also by one half.  That is why prophylactic antiestrogens can be used even in cases where the noninvasive breast cancer is estrogen receptor negative  Accordingly the overall plan is for surgery, followed by radiation, then a discussion of anti-estrogens.  There were 3 other issues.  One is the MRI.  The patient sought a second opinion at [facility] and they told her in their opinion she did not need an MRI.  We discussed the fact that MRIs do increase sensitivity, and find a synchronous breast cancer approximately 10% of the time.  However this has not been shown to improve mortality.  MRI s also result in many false positives.  At this point a Jackelyn has pretty much decided that she does not want an MRI  Secondly, she does qualify for genetics testing. In patients who carry a deleterious mutation [for example in a  BRCA gene], the risk of a new breast cancer developing in the future may be sufficiently great that the patient may choose bilateral mastectomies. However if she wishes to keep her breasts in that situation it is safe to do so. That would require intensified screening, which generally means not only yearly mammography but a yearly breast MRI as well. Of course, if there is a deleterious mutation bilateral oophorectomy would be necessary as there is no standard screening protocol for ovarian cancer.  At this point Tiyah feels pretty certain that even if she carries a deleterious mutation she would prefer intensified screening over bilateral mastectomies.  Her final concern is a trip to [place] which has been long planned between 07 /12 and 07/ 24.   Mitzy is considering postponing the surgery until after the trip and the [facility] folks told her they had no problems with that.  If she is going to have the surgery before the trip of course it would be best to have it as soon as possible so that she would have more fully recovered.  On the other hand she would like to wait for the results of her genetic test and that may take an  additional 2 weeks.   Once she has a better idea when she would like to have the test done she will let Dr. Ninfa Linden know  Saga has a good understanding of the overall plan. She agrees with it. She knows the goal of treatment in her case is cure. She will call with any problems that may develop before her next visit here.  Agam Davenport, Virgie Dad, MD  04/18/18 4:13 PM Medical Oncology and Hematology [facility] [address] [place], [place] [address] Tel. [phone]    Fax. [phone]  This document serves as a record of services personally performed by Chauncey Cruel, MD. It was created on his behalf by Margit Banda, a trained medical scribe. The creation of this record is based on the scribe's personal observations and the provider's statements to them.   I have reviewed the above documentation for accuracy and completeness, and I agree with the above. ,

## 2018-04-17 NOTE — Progress Notes (Signed)
REFERRING PROVIDER: Chauncey Cruel, MD [address] [place], [place] [address]  PRIMARY PROVIDER:  Wenda Low, MD  PRIMARY REASON FOR VISIT:  1. Family history of breast cancer   2. Family history of stomach cancer   3. Family history of thyroid cancer   4. Family history of skin cancer   5. Malignant neoplasm of right female breast, unspecified estrogen receptor status, unspecified site of breast ([place])     HISTORY OF PRESENT ILLNESS:   Crystal Harrison, a 47 y.o. female, was seen for a Crystal Harrison cancer genetics consultation at the request of Dr. Jana Harrison due to a personal and family history of cancer.  Crystal Harrison presents to clinic today to discuss the possibility of a hereditary predisposition to cancer, genetic testing, and to further clarify her future cancer risks, as well as potential cancer risks for family members.   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  She is currently considering treatment options at this time.  HORMONAL RISK FACTORS:  Menarche was at age 40.  OCP use for approximately 10 years.  Ovaries intact: yes.  Hysterectomy: no.  Menopausal status: premenopausal.  Colonoscopy: no; not examined. Mammogram within the last year: yes.   Past Medical History:  Diagnosis Date  . Allergy   . Breast cancer ([place]) 04/17/2018  . Family history of breast cancer   . Family history of skin cancer   . Family history of stomach cancer   . Family history of thyroid cancer   . Hypertension   . Thyroid disease   . Vitamin deficiency     No past surgical history on file.  Social History   Socioeconomic History  . Marital status: Married    Spouse name: Not on file  . Number of children: Not on file  . Years of education: Not on file  . Highest education level: Not on file  Occupational History  . Not on file  Social Needs  . Financial resource strain: Not on file  . Food insecurity:    Worry: Not  on file    Inability: Not on file  . Transportation needs:    Medical: Not on file    Non-medical: Not on file  Tobacco Use  . Smoking status: Former Smoker    Last attempt to quit: 07/24/2003    Years since quitting: 14.7  Substance and Sexual Activity  . Alcohol use: Yes  . Drug use: No  . Sexual activity: Not on file  Lifestyle  . Physical activity:    Days per week: Not on file    Minutes per session: Not on file  . Stress: Not on file  Relationships  . Social connections:    Talks on phone: Not on file    Gets together: Not on file    Attends religious service: Not on file    Active member of club or organization: Not on file    Attends meetings of clubs or organizations: Not on file    Relationship status: Not on file  Other Topics Concern  . Not on file  Social History Narrative  . Not on file     FAMILY HISTORY:  We obtained a detailed, 4-generation family history.  Significant diagnoses are listed below: Family History  Problem Relation Age of Onset  . Heart disease Mother   . Heart disease Father   . Cancer Maternal Aunt  breast  . Cancer Paternal Aunt        thyroid  . Cancer Paternal Grandmother        stomach   Crystal Harrison has 2 daughters with no history of cancer.  Crystal Harrison has 2 sisters in their 97's.  1 sister, Crystal Harrison, had breast cancer dx at 67.  She had genetic testing in 2014 that was negative.  This sister has 2 children.    Crystal Harrison father: is in his 6's and has recently been diagnosed with non-hodgkin's lymphoma.  He has a history of a few skin cancers in recent years.  Paternal aunts/Uncles: 4 paternal aunts, 2 paternal uncles: -1 paternal aunt died of stomach cancer in her 50's.  She had no children.  -1 paternal aunt died in her 66's due to thyroid cancer.  -2 paternal aunts in their 30's with no history of cancer.   -1 paternal uncle died in childhood, cause unk - 1 paternal uncle is in his 65's with no histoyr of cancer.   He has CP.  Paternal cousins: no known history of cancer.  Paternal grandfather: died at about 45 due to stomach cancer.  Paternal grandmother:died of breast cancer that metastasized to her liver dx in her 18's/70's.  Crystal Harrison reports her dad had a cousin who had cancer- type unk.   Crystal Harrison mother: 69, history of cancer dx at 58.  She had genetic testing in 2016 that was negative, but revealed a Variant of uncertain significance in the gene ATM p.T2142A (c.6424A>G).  Maternal Aunts/Uncles: 2 maternal aunts, one had breast cancer in her 2's.  1 maternal uncle died at 67 due to heart attack.  Maternal cousins: no cancer reported.  Maternal grandfather: died at 82 due to cardiac disease.  He had a sister who had breast cancer >50, a sister who had leg cancer, and a brother who had throat cancer.  Maternal grandmother:died at 52 due to heart disease.  She had 2 sisters and 4 brothers with no reported history of cancer.   Patient's maternal ancestors are of  German/Caucasian descent, and paternal ancestors are of German/Caucasian descent. There is no reported Ashkenazi Jewish ancestry. There is no known consanguinity.  GENETIC COUNSELING ASSESSMENT: Crystal Harrison is a 47 y.o. female with a personal and family which is somewhat suggestive of a Hereditary Cancer Predisposition Syndrome. We, therefore, discussed and recommended the following at today's visit.   DISCUSSION: We reviewed the characteristics, features and inheritance patterns of hereditary cancer syndromes. We also discussed genetic testing, including the appropriate family members to test, the process of testing, insurance coverage and turn-around-time for results. We discussed the implications of a negative, positive and/or variant of uncertain significant result. We recommended Crystal Harrison pursue genetic testing for the Common Hereditary Cancers gene panel + Thyroid cancer panel.   The Common Hereditary Cancer Panel offered  by [facility] includes sequencing and/or deletion duplication testing of the following 47 genes: APC, ATM, AXIN2, BARD1, BMPR1A, BRCA1, BRCA2, BRIP1, CDH1, CDKN2A (p14ARF), CDKN2A (p16INK4a), CKD4, CHEK2, CTNNA1, DICER1, EPCAM (Deletion/duplication testing only), GREM1 (promoter region deletion/duplication testing only), KIT, MEN1, MLH1, MSH2, MSH3, MSH6, MUTYH, NBN, NF1, NHTL1, PALB2, PDGFRA, PMS2, POLD1, POLE, PTEN, RAD50, RAD51C, RAD51D, SDHB, SDHC, SDHD, SMAD4, SMARCA4. STK11, TP53, TSC1, TSC2, and VHL.  The following genes were evaluated for sequence changes only: SDHA and HOXB13 c.251G>A variant only.  [facility] Thyroid Cancer Panel APC, CHEK2, DICER1, PRKAR1A, PTEN, RET, TP53  We discussed that only 5-10% of cancers  are associated with a Hereditary cancer predisposition syndrome.  One of the most common hereditary cancer syndromes that increases breast cancer risk is called Hereditary Breast and Ovarian Cancer (HBOC) syndrome.  This syndrome is caused by mutations in the BRCA1 and BRCA2 genes.  This syndrome increases an individual's lifetime risk to develop breast, ovarian, pancreatic, and other types of cancer.  There are also many other cancer predisposition syndromes caused by mutations in several other genes.  We discussed that if she is found to have a mutation in one of these genes, it may impact surgical decisions, and alter future medical management recommendations such as increased cancer screenings and consideration of risk reducing surgeries.  A positive result could also have implications for the patient's family members.  A Negative result would mean we were unable to identify a hereditary component to her cancer, but does not rule out the possibility of a hereditary basis for her cancer.  There could be mutations that are undetectable by current technology, or in genes not yet tested or identified to increase cancer risk.    We discussed the potential to find a Variant of Uncertain  Significance or VUS.  These are variants that have not yet been identified as pathogenic or benign, and it is unknown if this variant is associated with increased cancer risk or if this is a normal finding.  Most VUS's are reclassified to benign or likely benign.   It should not be used to make medical management decisions. With time, we suspect the lab will determine the significance of any VUS's identified if any.   Based on Crystal Harrison's personal and family history of cancer, she meets medical criteria for genetic testing. The laboratory will send an estimate of the cost with insurance and also offer the patient pay price of $250.  Crystal Harrison should respond in a timely manner to any communications from the laboratory.   PLAN: After considering the risks, benefits, and limitations, Crystal Harrison  provided informed consent to pursue genetic testing and the blood sample was sent to [facility] for analysis of the Common Hereditary Cancers Panel + Thyroid Cancer Panel. Results should be available within approximately 2-3 weeks' time, at which point they will be disclosed by telephone to Crystal Harrison, as will any additional recommendations warranted by these results. Crystal Harrison will receive a summary of her genetic counseling visit and a copy of her results once available. This information will also be available in Epic. We encouraged Crystal Harrison to remain in contact with cancer genetics annually so that we can continuously update the family history and inform her of any changes in cancer genetics and testing that may be of benefit for her family. Crystal Harrison questions were answered to her satisfaction today. Our contact information was provided should additional questions or concerns arise.  Based on Crystal Harrison's family history, we recommended her paternal relatives, also have genetic counseling and testing. Crystal Harrison will let us know if we can be of any assistance in coordinating genetic  counseling and/or testing for this family member.   Lastly, we encouraged Crystal Harrison to remain in contact with cancer genetics annually so that we can continuously update the family history and inform her of any changes in cancer genetics and testing that may be of benefit for this family.   Crystal Harrison.  Pro questions were answered to her satisfaction today. Our contact information was provided should additional questions or concerns arise. Thank you for the referral and allowing  [place] to share in the care of your patient.   Tana Felts, Crystal Harrison, [facility] Certified Genetic Counselor [email or web address] phone: [phone]  The patient was seen for a total of 40 minutes in face-to-face genetic counseling.  This patient was discussed with Drs. Magrinat, Lindi Adie and/or Burr Medico who agrees with the above.

## 2018-04-18 ENCOUNTER — Inpatient Hospital Stay (HOSPITAL_BASED_OUTPATIENT_CLINIC_OR_DEPARTMENT_OTHER): Payer: BLUE CROSS/BLUE SHIELD | Admitting: Oncology

## 2018-04-18 ENCOUNTER — Inpatient Hospital Stay: Payer: BLUE CROSS/BLUE SHIELD

## 2018-04-18 DIAGNOSIS — Z170 Estrogen receptor positive status [ER+]: Secondary | ICD-10-CM

## 2018-04-18 DIAGNOSIS — D0511 Intraductal carcinoma in situ of right breast: Secondary | ICD-10-CM | POA: Diagnosis not present

## 2018-04-18 DIAGNOSIS — C50511 Malignant neoplasm of lower-outer quadrant of right female breast: Secondary | ICD-10-CM

## 2018-04-18 LAB — CMP (CANCER CENTER ONLY)
ALBUMIN: 4.2 g/dL (ref 3.5–5.0)
ALK PHOS: 71 U/L (ref 40–150)
ALT: 16 U/L (ref 0–55)
AST: 21 U/L (ref 5–34)
Anion gap: 7 (ref 3–11)
BUN: 11 mg/dL (ref 7–26)
CALCIUM: 9.5 mg/dL (ref 8.4–10.4)
CO2: 27 mmol/L (ref 22–29)
CREATININE: 0.82 mg/dL (ref 0.60–1.10)
Chloride: 104 mmol/L (ref 98–109)
GFR, Estimated: >60 mL/min (ref >60)
GLUCOSE: 90 mg/dL (ref 70–140)
Potassium: 3.8 mmol/L (ref 3.5–5.1)
SODIUM: 138 mmol/L (ref 136–145)
Total Bilirubin: 0.2 mg/dL (ref 0.2–1.2)
Total Protein: 7.6 g/dL (ref 6.4–8.3)

## 2018-04-18 LAB — CBC WITH DIFFERENTIAL (CANCER CENTER ONLY)
BASOS PCT: 0 %
Basophils Absolute: 0 10*3/uL (ref 0.0–0.1)
Eosinophils Absolute: 0.6 10*3/uL — ABNORMAL HIGH (ref 0.0–0.5)
Eosinophils Relative: 8 %
HEMATOCRIT: 41 % (ref 34.8–46.6)
HEMOGLOBIN: 13.7 g/dL (ref 11.6–15.9)
LYMPHS ABS: 2.3 10*3/uL (ref 0.9–3.3)
Lymphocytes Relative: 32 %
MCH: 28.5 pg (ref 25.1–34.0)
MCHC: 33.4 g/dL (ref 31.5–36.0)
MCV: 85.4 fL (ref 79.5–101.0)
MONOS PCT: 6 %
Monocytes Absolute: 0.4 10*3/uL (ref 0.1–0.9)
NEUTROS ABS: 3.8 10*3/uL (ref 1.5–6.5)
NEUTROS PCT: 54 %
Platelet Count: 244 10*3/uL (ref 145–400)
RBC: 4.8 MIL/uL (ref 3.70–5.45)
RDW: 17.6 % — AB (ref 11.2–14.5)
WBC Count: 7.1 10*3/uL (ref 3.9–10.3)

## 2018-04-18 NOTE — Progress Notes (Signed)
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    Histology per Pathology Report: 04/05/2018 Right Breast    Receptor Status: ER(+ 40%), PR (- 0%), Her2-neu (), Ki-()   Past/Anticipated interventions by surgeon, if any: Dr. Ninfa Linden 04/12/2018 -We discussed both breast conservation and mastectomy and benefits of each.  At this point, she would like to proceed with a radioactive CT guided right breast partial mastectomy.   -We will refer her to medical and radiation oncology as well as genetics given her family history. -Also, given the denseness of her breasts and her family history, bilateral preoperative breast MRI is recommended and will be scheduled as well.  Past/Anticipated interventions by medical oncology, if any: Chemotherapy  Dr. Jana Hakim 5/23 Discussed her diagnosis and options for treatment.  Lymphedema issues, if any:  No  Pain issues, if any: No  BP 115/68 (BP Location: Left Arm, Patient Position: Sitting, Cuff Size: Normal)   Pulse (!) 57   Temp 98.2 F (36.8 C) (Oral)   Resp 16   Ht 5' 6.5" (1.689 m)   Wt 167 lb 9.6 oz (76 kg)   SpO2 100%   BMI 26.65 kg/m    Wt Readings from Last 3 Encounters:  04/23/18 167 lb 9.6 oz (76 kg)  04/18/18 166 lb 12.8 oz (75.7 kg)  07/24/12 147 lb 12.8 oz (67 kg)    SAFETY ISSUES:  Prior radiation? No  Pacemaker/ICD? No  Possible current pregnancy? No  Is the patient on methotrexate? No  Current Complaints / other details:      Cori Razor, RN 04/18/2018,3:18 PM

## 2018-04-23 ENCOUNTER — Other Ambulatory Visit: Payer: Self-pay

## 2018-04-23 ENCOUNTER — Ambulatory Visit
Admission: RE | Admit: 2018-04-23 | Discharge: 2018-04-23 | Disposition: A | Discharge status: Home / Self Care | Payer: BLUE CROSS/BLUE SHIELD | Source: Ambulatory Visit | Attending: Radiation Oncology | Admitting: Radiation Oncology

## 2018-04-23 ENCOUNTER — Encounter: Payer: Self-pay | Admitting: Radiation Oncology

## 2018-04-23 VITALS — BP 115/68 | HR 57 | Temp 98.2°F | Resp 16 | Ht 66.5 in | Wt 167.6 lb

## 2018-04-23 DIAGNOSIS — Z87891 Personal history of nicotine dependence: Secondary | ICD-10-CM | POA: Insufficient documentation

## 2018-04-23 DIAGNOSIS — Z170 Estrogen receptor positive status [ER+]: Secondary | ICD-10-CM | POA: Insufficient documentation

## 2018-04-23 DIAGNOSIS — Z79899 Other long term (current) drug therapy: Secondary | ICD-10-CM | POA: Diagnosis not present

## 2018-04-23 DIAGNOSIS — Z803 Family history of malignant neoplasm of breast: Secondary | ICD-10-CM | POA: Insufficient documentation

## 2018-04-23 DIAGNOSIS — I10 Essential (primary) hypertension: Secondary | ICD-10-CM | POA: Insufficient documentation

## 2018-04-23 DIAGNOSIS — Z800 Family history of malignant neoplasm of digestive organs: Secondary | ICD-10-CM | POA: Diagnosis not present

## 2018-04-23 DIAGNOSIS — D0511 Intraductal carcinoma in situ of right breast: Secondary | ICD-10-CM

## 2018-04-23 DIAGNOSIS — Z809 Family history of malignant neoplasm, unspecified: Secondary | ICD-10-CM | POA: Insufficient documentation

## 2018-04-23 DIAGNOSIS — E079 Disorder of thyroid, unspecified: Secondary | ICD-10-CM | POA: Diagnosis not present

## 2018-04-23 DIAGNOSIS — E559 Vitamin D deficiency, unspecified: Secondary | ICD-10-CM | POA: Insufficient documentation

## 2018-04-23 DIAGNOSIS — Z808 Family history of malignant neoplasm of other organs or systems: Secondary | ICD-10-CM | POA: Diagnosis not present

## 2018-04-23 NOTE — Progress Notes (Signed)
Radiation Oncology         [phone] ________________________________  Name: Crystal Harrison        MRN: [id number]  Date of Service: 04/23/2018 DOB: 25-Oct-1971  IP:[id number], Denton Ar, MD  Coralie Keens, MD     REFERRING PHYSICIAN: Coralie Keens, MD   DIAGNOSIS: The encounter diagnosis was Ductal carcinoma in situ (DCIS) of right breast.   HISTORY OF PRESENT ILLNESS: Crystal Harrison is a 47 y.o. female seen at the request of Dr. Jana Hakim in [place] for new diagnosis of right breast cancer.  The patient was found to have screening detected calcifications on mammography.  She proceeded with diagnostic imaging which revealed heterogeneous calcifications measuring about 1 cm in the upper inner breast without associated mass.  A biopsy under [facility] guidance on 04/05/2018 revealed high-grade DCIS with calcifications, ER positive, PR negative.  Her case was discussed in conference, and the plan was to proceed with breast MRI scheduled on 04/27/2018, and she is also met with genetics.  Provided her disease is still limited, she appeared to be a candidate for lumpectomy, and comes today to discuss the role of adjuvant radiotherapy. Of note she has also had a second opinion at [facility] and has cancelled her MRI. She has also met with their genetics team. She is planning a vacation to [place] this summer and is hoping to postpone surgery until after returning at the end of July. She does however wish to proceed with radiotherapy closer to home when the time is appropriate for adjuvant radiation.    PREVIOUS RADIATION THERAPY: No   PAST MEDICAL HISTORY:  Past Medical History:  Diagnosis Date  . Allergy   . Breast cancer ([place]) 04/17/2018  . Family history of breast cancer   . Family history of skin cancer   . Family history of stomach cancer   . Family history of thyroid cancer   . Hypertension   . Thyroid disease   . Vitamin deficiency        PAST SURGICAL HISTORY: Past Surgical History:    Procedure Laterality Date  . WISDOM TOOTH EXTRACTION N/A      FAMILY HISTORY:  Family History  Problem Relation Age of Onset  . Heart disease Mother   . Heart disease Father   . Cancer Maternal Aunt        breast  . Cancer Paternal Aunt        thyroid  . Cancer Paternal Grandmother        breast  . Cancer Paternal Grandfather        stomach  . Stomach cancer Paternal Aunt      SOCIAL HISTORY:  reports that she quit smoking about 14 years ago. She has never used smokeless tobacco. She reports that she drinks alcohol. She reports that she does not use drugs.  The patient is married and lives in [place].  She is a Art therapist.   ALLERGIES: Patient has no active allergies.   MEDICATIONS:  Current Outpatient Medications  Medication Sig Dispense Refill  . albuterol (PROVENTIL HFA;VENTOLIN HFA) 108 (90 BASE) MCG/ACT inhaler Inhale 2 puffs into the lungs every 6 (six) hours as needed.    . cholecalciferol (VITAMIN D) 1000 UNITS tablet Take 1,000 Units by mouth daily.    . hydrochlorothiazide (HYDRODIURIL) 25 MG tablet Take 25 mg by mouth daily.    . IRON, FERROUS SULFATE, PO Take 65 mg by mouth 1 day or 1 dose.    . levothyroxine (SYNTHROID, LEVOTHROID)  75 MCG tablet Take 75 mcg by mouth daily.    . montelukast (SINGULAIR) 10 MG tablet Take 10 mg by mouth as needed.     . ramipril (ALTACE) 5 MG tablet Take 5 mg by mouth daily.     No current facility-administered medications for this encounter.      REVIEW OF SYSTEMS: On review of systems, the patient reports that she is doing well overall. She denies any chest pain, shortness of breath, cough, fevers, chills, night sweats, unintended weight changes. She denies any bowel or bladder disturbances, and denies abdominal pain, nausea or vomiting. She denies any new musculoskeletal or joint aches or pains. A complete review of systems is obtained and is otherwise negative.     PHYSICAL EXAM:  Wt Readings from Last 3  Encounters:  04/23/18 167 lb 9.6 oz (76 kg)  04/18/18 166 lb 12.8 oz (75.7 kg)  07/24/12 147 lb 12.8 oz (67 kg)   Temp Readings from Last 3 Encounters:  04/23/18 98.2 F (36.8 C) (Oral)  04/18/18 98.5 F (36.9 C) (Oral)  07/24/12 97.6 F (36.4 C) (Temporal)   BP Readings from Last 3 Encounters:  04/23/18 115/68  04/18/18 137/74  07/24/12 108/68   Pulse Readings from Last 3 Encounters:  04/23/18 (!) 57  04/18/18 (!) 51  07/24/12 64   Pain Assessment Pain Score: 0-No pain/10  In general this is a well appearing caucasian female in no acute distress. She is alert and oriented x4 and appropriate throughout the examination. HEENT reveals that the patient is normocephalic, atraumatic. EOMs are intact.  Skin is intact without any evidence of gross lesions. Cardiopulmonary assessment is negative for acute distress and she exhibits normal effort. Bilateral breast exam is deferred.  ECOG = 0  0 - Asymptomatic (Fully active, able to carry on all predisease activities without restriction)  1 - Symptomatic but completely ambulatory (Restricted in physically strenuous activity but ambulatory and able to carry out work of a light or sedentary nature. For example, light housework, office work)  2 - Symptomatic, <50% in bed during the day (Ambulatory and capable of all self care but unable to carry out any work activities. Up and about more than 50% of waking hours)  3 - Symptomatic, >50% in bed, but not bedbound (Capable of only limited self-care, confined to bed or chair 50% or more of waking hours)  4 - Bedbound (Completely disabled. Cannot carry on any self-care. Totally confined to bed or chair)  5 - Death   Eustace Pen MM, Creech RH, Tormey DC, et al. [phone]). "Toxicity and response criteria of the [facility]". [place] Oncol. 5 (6): 649-55    LABORATORY DATA:  Lab Results  Component Value Date   WBC 7.1 04/18/2018   HGB 13.7 04/18/2018   HCT 41.0  04/18/2018   MCV 85.4 04/18/2018   PLT 244 04/18/2018   Lab Results  Component Value Date   NA 138 04/18/2018   K 3.8 04/18/2018   CL 104 04/18/2018   CO2 27 04/18/2018   Lab Results  Component Value Date   ALT 16 04/18/2018   AST 21 04/18/2018   ALKPHOS 71 04/18/2018   BILITOT 0.2 04/18/2018      RADIOGRAPHY: Mm Digital Diagnostic Unilat R  Result Date: 04/01/2018 CLINICAL DATA:  48 year old female for evaluation of new RIGHT breast calcifications identified on screening mammogram. Strong family history of breast cancer with mother and sister diagnosed. EXAM: DIGITAL DIAGNOSTIC RIGHT MAMMOGRAM WITH  CAD COMPARISON:  Previous exam(s). ACR Breast Density Category c: The breast tissue is heterogeneously dense, which may obscure small masses. FINDINGS: Full field LATERAL and magnification views of the RIGHT breast demonstrate a 1 cm group of slightly heterogeneous calcifications within the UPPER slightly INNER RIGHT breast. No associated mass identified. Mammographic images were processed with CAD. IMPRESSION: 1 cm group of indeterminate calcifications within the UPPER slightly INNER RIGHT breast. Tissue sampling recommended. RECOMMENDATION: Stereotactic guided RIGHT breast biopsy, which will be arranged. I have discussed the findings and recommendations with the patient. Results were also provided in writing at the conclusion of the visit. If applicable, a reminder letter will be sent to the patient regarding the next appointment. BI-RADS CATEGORY  4: Suspicious. Electronically Signed   By: Margarette Canada M.D.   On: 04/01/2018 13:27   Mm 3d Screen Breast Bilateral  Result Date: 03/27/2018 CLINICAL DATA:  Screening. EXAM: DIGITAL SCREENING BILATERAL MAMMOGRAM WITH TOMO AND CAD COMPARISON:  Previous exam(s). ACR Breast Density Category c: The breast tissue is heterogeneously dense, which may obscure small masses. FINDINGS: In the right breast, calcifications warrant further evaluation with magnified  views. In the left breast, no findings suspicious for malignancy. Images were processed with CAD. IMPRESSION: Further evaluation is suggested for calcifications in the right breast. RECOMMENDATION: Diagnostic mammogram of the right breast. (Code:[id number]) The patient will be contacted regarding the findings, and additional imaging will be scheduled. BI-RADS CATEGORY  0: Incomplete. Need additional imaging evaluation and/or prior mammograms for comparison. Electronically Signed   By: Abelardo Diesel M.D.   On: 03/27/2018 10:30   Mm Clip Placement Right  Result Date: 04/05/2018 CLINICAL DATA:  Status post stereotactic core needle biopsy of right breast calcifications. EXAM: DIAGNOSTIC RIGHT MAMMOGRAM POST STEREOTACTIC BIOPSY COMPARISON:  Previous exam(s). FINDINGS: Mammographic images were obtained following stereotactic guided biopsy of right breast calcifications. The biopsy clip lies adjacent to a few residual calcifications in the posterior, upper and slightly medial aspect of the right breast. IMPRESSION: Well-positioned coil shaped biopsy clip following stereotactic core needle biopsy of right breast calcifications. Final Assessment: Post Procedure Mammograms for Marker Placement Electronically Signed   By: Lajean Manes M.D.   On: 04/05/2018 12:00   Mm Rt Breast Bx W Loc Dev 1st Lesion Image Bx Spec Stereo Guide  Addendum Date: 04/08/2018   ADDENDUM REPORT: 04/08/2018 14:31 ADDENDUM: Pathology revealed HIGH GRADE DUCTAL CARCINOMA IN SITU WITH CALCIFICATIONS of RIGHT breast, 12:30 o'clock. This was found to be concordant by Dr. Lajean Manes. Pathology results were discussed with the patient by telephone. The patient reported doing well after the biopsy with tenderness at the site. Post biopsy instructions and care were reviewed and questions were answered. The patient was encouraged to call [facility] for any additional concerns. Surgical consultation has been arranged with Dr.  Coralie Keens at [facility] on Apr 11, 2018. Pathology results reported by Roselind Messier, RN on 04/08/2018. Electronically Signed   By: Lajean Manes M.D.   On: 04/08/2018 14:31   Result Date: 04/08/2018 CLINICAL DATA:  Patient presents for stereotactic core needle biopsy of right breast calcifications. EXAM: RIGHT BREAST STEREOTACTIC CORE NEEDLE BIOPSY COMPARISON:  Previous exams. FINDINGS: The patient and I discussed the procedure of stereotactic-guided biopsy including benefits and alternatives. We discussed the high likelihood of a successful procedure. We discussed the risks of the procedure including infection, bleeding, tissue injury, clip migration, and inadequate sampling. Informed written consent was given. The usual time  out protocol was performed immediately prior to the procedure. Using sterile technique and 1% Lidocaine as local anesthetic, under stereotactic guidance, a 9 gauge vacuum assisted device was used to perform core needle biopsy of calcifications in the upper, slightly inner quadrant the right breast, at 12:30 o'clock, using a superior approach. Specimen radiograph was performed showing calcifications for which biopsy was performed. Specimens with calcifications are identified for pathology. Lesion quadrant: Upper inner quadrant At the conclusion of the procedure, a coil shaped tissue marker clip was deployed into the biopsy cavity. Follow-up 2-view mammogram was performed and dictated separately. IMPRESSION: Stereotactic-guided biopsy of right breast calcifications. No apparent complications. Electronically Signed: By: Lajean Manes M.D. On: 04/05/2018 11:51       IMPRESSION/PLAN: 1. Grade 3, ER Positive DCIS of the right breast. Dr. Lisbeth Renshaw discusses the pathology findings and reviews the nature of non invasive breast disease. The consensus from the breast conference includes breast conservation with lumpectomy. Her course would then be followed by external radiotherapy  to the breast followed by antiestrogen therapy. We discussed the risks, benefits, short, and long term effects of radiotherapy, and the patient is interested in proceeding. Dr. Lisbeth Renshaw discusses the delivery and logistics of radiotherapy and anticipates a course of 6 1/2 weeks of radiotherapy. We will see her back about 2 weeks after surgery to discuss the simulation process and anticipate we starting radiotherapy about 4-6 weeks after surgery. I will also ask our navigators to reach out to the patient so she has access to their care.  2. Possible genetic predisposition to malignancy. The patient is a candidate for genetic testing given her personal and family history. She was offered referral and has already met with folks at [facility].  In a visit lasting 60 minutes, greater than 50% of the time was spent face to face discussing her case, and coordinating the patient's care.  The above documentation reflects my direct findings during this shared patient visit. Please see the separate note by Dr. Lisbeth Renshaw on this date for the remainder of the patient's plan of care.    Carola Rhine, Pb

## 2018-04-27 ENCOUNTER — Other Ambulatory Visit: Payer: BLUE CROSS/BLUE SHIELD

## 2018-05-10 ENCOUNTER — Telehealth: Payer: Self-pay | Admitting: Genetics

## 2018-05-10 NOTE — Telephone Encounter (Signed)
Revealed negative genetic testing.  Revealed that a VUS in ATM was identified (same one found in her mother).   This normal result means we did not identify a hereditary cause for Crystal Harrison' cancer. While reassuring, genetic testing is not perfect and cannot definitively rule out a hereditary cause.  It will be important for her to keep in contact with genetics to learn if any additional testing may be needed in the future.   Recommended her daughters start breast cancer screening in late 30's (or earlier if Dr.'s recommend/risk model).

## 2018-05-13 ENCOUNTER — Ambulatory Visit: Payer: Self-pay | Admitting: Genetics

## 2018-05-13 ENCOUNTER — Encounter: Payer: Self-pay | Admitting: Genetics

## 2018-05-13 DIAGNOSIS — C50911 Malignant neoplasm of unspecified site of right female breast: Secondary | ICD-10-CM

## 2018-05-13 DIAGNOSIS — Z1379 Encounter for other screening for genetic and chromosomal anomalies: Secondary | ICD-10-CM

## 2018-05-13 DIAGNOSIS — D0511 Intraductal carcinoma in situ of right breast: Secondary | ICD-10-CM

## 2018-05-13 DIAGNOSIS — Z808 Family history of malignant neoplasm of other organs or systems: Secondary | ICD-10-CM

## 2018-05-13 DIAGNOSIS — Z800 Family history of malignant neoplasm of digestive organs: Secondary | ICD-10-CM

## 2018-05-13 DIAGNOSIS — Z803 Family history of malignant neoplasm of breast: Secondary | ICD-10-CM

## 2018-05-13 NOTE — Progress Notes (Signed)
HPI:  Ms. Hemmelgarn was previously seen in the [facility] on 04/17/2018 due to a personal and family history of breast cancer and concerns regarding a hereditary predisposition to cancer. Please refer to our prior cancer genetics clinic note for more information regarding Ms. Haslem's medical, social and family histories, and our assessment and recommendations, at the time. Ms. Osinski recent genetic test results were disclosed to her, as well as recommendations warranted by these results. These results and recommendations are discussed in more detail below.  CANCER HISTORY:    Ductal carcinoma in situ (DCIS) of right breast   04/18/2018 Initial Diagnosis    Ductal carcinoma in situ (DCIS) of right breast      05/09/2018 Genetic Testing    The Common Hereditary Cancer Panel + Thyroid Cancer Panel was ordered: The following genes were evaluated for sequence changes and exonic deletions/duplications: APC, ATM, AXIN2, BARD1, BMPR1A, BRCA1, BRCA2, BRIP1, CDH1, CDK4, CDKN2A (p14ARF), CDKN2A (p16INK4a), CHEK2, CTNNA1, DICER1, EPCAM*, GREM1*, KIT, MEN1, MLH1, MSH2, MSH3, MSH6, MUTYH, NBN, NF1, PALB2, PDGFRA, PMS2, POLD1, POLE, PRKAR1A, PTEN, RAD50, RAD51C, RAD51D, RET, SDHB, SDHC, SDHD, SMAD4, SMARCA4, STK11, TP53, TSC1, TSC2, VHL. The following genes were evaluated for sequence changes only: HOXB13*, NTHL1*, SDHA  Results: No pathogenic variants identified.  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  The date of this test report is 05/09/2018.         FAMILY HISTORY:  We obtained a detailed, 4-generation family history.  Significant diagnoses are listed below: Family History  Problem Relation Age of Onset  . Heart disease Mother   . Heart disease Father   . Cancer Maternal Aunt        breast  . Cancer Paternal Aunt        thyroid  . Cancer Paternal Grandmother        breast  . Cancer Paternal Grandfather        stomach    . Stomach cancer Paternal Aunt     Ms. Allbright has 2 daughters with no history of cancer.  Ms. Scullin has 2 sisters in their 31's.  1 sister, Barnetta Chapel, had breast cancer dx at 31.  She had genetic testing in 2014 that was negative.  This sister has 2 children.    Ms. Notaro father: is in his 28's and has recently been diagnosed with non-hodgkin's lymphoma.  He has a history of a few skin cancers in recent years.  Paternal aunts/Uncles: 4 paternal aunts, 2 paternal uncles: -1 paternal aunt died of stomach cancer in her 34's.  She had no children.  -1 paternal aunt died in her 45's due to thyroid cancer.  -2 paternal aunts in their 16's with no history of cancer.   -1 paternal uncle died in childhood, cause unk - 1 paternal uncle is in his 15's with no histoyr of cancer.  He has CP.  Paternal cousins: no known history of cancer.  Paternal grandfather: died at about 53 due to stomach cancer.  Paternal grandmother:died of breast cancer that metastasized to her liver dx in her 86's/70's.  Ms. Shisler reports her dad had a cousin who had cancer- type unk.   Ms. Mclaine mother: 67, history of cancer dx at 15.  She had genetic testing in 2016 that was negative, but revealed a Variant of uncertain significance in the gene ATM p.T2142A (c.6424A>G).  Maternal Aunts/Uncles: 2 maternal aunts, one had breast cancer in her 79's.  1  maternal uncle died at 32 due to heart attack.  Maternal cousins: no cancer reported.  Maternal grandfather: died at 42 due to cardiac disease.  He had a sister who had breast cancer >50, a sister who had leg cancer, and a brother who had throat cancer.  Maternal grandmother:died at 89 due to heart disease.  She had 2 sisters and 4 brothers with no reported history of cancer.   Patient's maternal ancestors are of  German/Caucasian descent, and paternal ancestors are of German/Caucasian descent. There is no reported Ashkenazi Jewish ancestry. There is no known  consanguinity.  GENETIC TEST RESULTS: Genetic testing performed through [facility] Common Hereditary Cancers Panel + Thyroid Cancer Panel reported out on 05/09/2018 showed no pathogenic mutations. The following genes were evaluated for sequence changes and exonic deletions/duplications: APC, ATM, AXIN2, BARD1, BMPR1A, BRCA1, BRCA2, BRIP1, CDH1, CDK4, CDKN2A (p14ARF), CDKN2A (p16INK4a), CHEK2, CTNNA1, DICER1, EPCAM*, GREM1*, KIT, MEN1, MLH1, MSH2, MSH3, MSH6, MUTYH, NBN, NF1, PALB2, PDGFRA, PMS2, POLD1, POLE, PRKAR1A, PTEN, RAD50, RAD51C, RAD51D, RET, SDHB, SDHC, SDHD, SMAD4, SMARCA4, STK11, TP53, TSC1, TSC2, VHL. The following genes were evaluated for sequence changes only: HOXB13*, NTHL1*, SDHA.  A variant of uncertain significance (VUS) in a gene called ATM was also noted. c.6424A>G (p.Thr2142Ala)  The test report will be scanned into EPIC and will be located under the Molecular Pathology section of the Results Review tab. A portion of the result report is included below for reference.     We discussed with Ms. Bias that because current genetic testing is not perfect, it is possible there may be a gene mutation in one of these genes that current testing cannot detect, but that chance is small.  We also discussed, that there could be another gene that has not yet been discovered, or that we have not yet tested, that is responsible for the cancer diagnoses in the family. It is also possible there is a hereditary cause for the cancer in the family that Ms. Knouff did not inherit and therefore was not identified in her testing.  Therefore, it is important to remain in touch with cancer genetics in the future so that we can continue to offer Ms. Tess the most up to date genetic testing.   Regarding the VUS in ATM: At this time, it is unknown if this variant is associated with increased cancer risk or if this is a normal finding, but most variants such as this get reclassified to being inconsequential.  It should not be used to make medical management decisions. With time, we suspect the lab will determine the significance of this variant, if any. If we do learn more about it, we will try to contact Ms. Olsson to discuss it further. However, it is important to stay in touch with [place] periodically and keep the address and phone number up to date.  ADDITIONAL GENETIC TESTING: We discussed with Ms. Radick that there are other genes that are associated with increased cancer risk that can be analyzed. The laboratories that offer this testing look at these additional genes via a hereditary cancer gene panel. Should Ms. Faraci wish to pursue additional genetic testing, we are happy to discuss and coordinate this testing, at any time.    CANCER SCREENING RECOMMENDATIONS: This negative genetic test result indicates we have not identified a hereditary cause for Ms. Federer' cancer.    While reassuring, Ms. Volpi personal and family history is still suspicious of a hereditary predisposition to cancer.  Therefore, we must consider these  negative results carefully.  Families with features suggestive of hereditary risk for cancer tend to have multiple family members with cancer, diagnoses in multiple generations, and diagnoses before the age of 68. Ms. Toback family exhibits some of these features. Therefore this negative result may actually be due to the limitations of current technology to detect all mutations within these genes, or there may be a different gene that has not yet been discovered or tested.   We recommend she continue to follow her oncology and other health care provider's recommendations regarding cancer management and screening.   RECOMMENDATIONS FOR FAMILY MEMBERS:  Relatives in this family might be at some increased risk of developing cancer, over the general population risk, simply due to the family history of cancer.  We recommended women in this family have a yearly mammogram beginning  at age 41, or 47 years younger than the earliest onset of cancer, an annual clinical breast exam, and perform monthly breast self-exams. We recommend her daugthers inform their doctors about the family history of caner so they can make an individualized screening plan for them- we would recommend they start routine breast at least by their late 30's (if not earlier per risk models performed by their doctors).   Women in this family should also have a gynecological exam as recommended by their primary provider. All family members should have a colonoscopy by age 52 (or as directed by their doctors).  All family members should inform their physicians about the family history of cancer so their doctors can make the most appropriate screening recommendations for them.   It is also possible there is a hereditary cause for the cancer in Ms. Rackers's family that she did not inherit and therefore was not identified in her.  We recommended paternal relatievs, have genetic counseling and testing. Ms. Goucher will let us know if we can be of any assistance in coordinating genetic counseling and/or testing for these family members.   FOLLOW-UP: Lastly, we discussed with Ms. Guarino that cancer genetics is a rapidly advancing field and it is possible that new genetic tests will be appropriate for her and/or her family members in the future. We encouraged her to remain in contact with cancer genetics on an annual basis so we can update her personal and family histories and let her know of advances in cancer genetics that may benefit this family.   Our contact number was provided. Ms. Melberg questions were answered to her satisfaction, and she knows she is welcome to call us at anytime with additional questions or concerns.   Ferol Luz, MS, [facility] Certified Genetic Counselor [email or web address]

## 2018-06-24 DIAGNOSIS — Z79899 Other long term (current) drug therapy: Secondary | ICD-10-CM | POA: Diagnosis not present

## 2018-06-24 DIAGNOSIS — I10 Essential (primary) hypertension: Secondary | ICD-10-CM | POA: Diagnosis not present

## 2018-06-24 DIAGNOSIS — J45909 Unspecified asthma, uncomplicated: Secondary | ICD-10-CM | POA: Diagnosis not present

## 2018-06-24 DIAGNOSIS — D0511 Intraductal carcinoma in situ of right breast: Secondary | ICD-10-CM | POA: Diagnosis not present

## 2018-06-24 DIAGNOSIS — R921 Mammographic calcification found on diagnostic imaging of breast: Secondary | ICD-10-CM | POA: Diagnosis not present

## 2018-06-24 DIAGNOSIS — C50211 Malignant neoplasm of upper-inner quadrant of right female breast: Secondary | ICD-10-CM | POA: Diagnosis not present

## 2018-06-24 DIAGNOSIS — Z87891 Personal history of nicotine dependence: Secondary | ICD-10-CM | POA: Diagnosis not present

## 2018-06-24 DIAGNOSIS — Z170 Estrogen receptor positive status [ER+]: Secondary | ICD-10-CM | POA: Diagnosis not present

## 2018-06-24 DIAGNOSIS — Z7951 Long term (current) use of inhaled steroids: Secondary | ICD-10-CM | POA: Diagnosis not present

## 2018-06-24 DIAGNOSIS — E039 Hypothyroidism, unspecified: Secondary | ICD-10-CM | POA: Diagnosis not present

## 2018-06-24 DIAGNOSIS — Z808 Family history of malignant neoplasm of other organs or systems: Secondary | ICD-10-CM | POA: Diagnosis not present

## 2018-07-02 DIAGNOSIS — D0511 Intraductal carcinoma in situ of right breast: Secondary | ICD-10-CM | POA: Diagnosis not present

## 2018-07-15 DIAGNOSIS — D0511 Intraductal carcinoma in situ of right breast: Secondary | ICD-10-CM | POA: Diagnosis not present

## 2018-07-16 DIAGNOSIS — M96842 Postprocedural seroma of a musculoskeletal structure following a musculoskeletal system procedure: Secondary | ICD-10-CM | POA: Diagnosis not present

## 2018-07-16 DIAGNOSIS — D0511 Intraductal carcinoma in situ of right breast: Secondary | ICD-10-CM | POA: Diagnosis not present

## 2018-07-16 DIAGNOSIS — Z87891 Personal history of nicotine dependence: Secondary | ICD-10-CM | POA: Diagnosis not present

## 2018-07-16 DIAGNOSIS — Z6825 Body mass index (BMI) 25.0-25.9, adult: Secondary | ICD-10-CM | POA: Diagnosis not present

## 2018-07-16 DIAGNOSIS — E039 Hypothyroidism, unspecified: Secondary | ICD-10-CM | POA: Diagnosis not present

## 2018-07-16 DIAGNOSIS — I10 Essential (primary) hypertension: Secondary | ICD-10-CM | POA: Diagnosis not present

## 2018-07-16 DIAGNOSIS — E663 Overweight: Secondary | ICD-10-CM | POA: Diagnosis not present

## 2018-07-16 DIAGNOSIS — D649 Anemia, unspecified: Secondary | ICD-10-CM | POA: Diagnosis not present

## 2018-07-16 DIAGNOSIS — Z171 Estrogen receptor negative status [ER-]: Secondary | ICD-10-CM | POA: Diagnosis not present

## 2018-07-16 DIAGNOSIS — L7634 Postprocedural seroma of skin and subcutaneous tissue following other procedure: Secondary | ICD-10-CM | POA: Diagnosis not present

## 2018-07-16 DIAGNOSIS — J45909 Unspecified asthma, uncomplicated: Secondary | ICD-10-CM | POA: Diagnosis not present

## 2018-07-23 DIAGNOSIS — D0511 Intraductal carcinoma in situ of right breast: Secondary | ICD-10-CM | POA: Diagnosis not present

## 2018-08-05 DIAGNOSIS — D0511 Intraductal carcinoma in situ of right breast: Secondary | ICD-10-CM | POA: Diagnosis not present

## 2018-08-05 DIAGNOSIS — E039 Hypothyroidism, unspecified: Secondary | ICD-10-CM | POA: Diagnosis not present

## 2018-08-05 DIAGNOSIS — M96842 Postprocedural seroma of a musculoskeletal structure following a musculoskeletal system procedure: Secondary | ICD-10-CM | POA: Diagnosis not present

## 2018-08-05 DIAGNOSIS — Z87891 Personal history of nicotine dependence: Secondary | ICD-10-CM | POA: Diagnosis not present

## 2018-08-05 DIAGNOSIS — J45909 Unspecified asthma, uncomplicated: Secondary | ICD-10-CM | POA: Diagnosis not present

## 2018-08-05 DIAGNOSIS — I10 Essential (primary) hypertension: Secondary | ICD-10-CM | POA: Diagnosis not present

## 2018-08-05 DIAGNOSIS — Z79899 Other long term (current) drug therapy: Secondary | ICD-10-CM | POA: Diagnosis not present

## 2018-08-13 DIAGNOSIS — D0511 Intraductal carcinoma in situ of right breast: Secondary | ICD-10-CM | POA: Diagnosis not present

## 2018-08-14 DIAGNOSIS — D0511 Intraductal carcinoma in situ of right breast: Secondary | ICD-10-CM | POA: Diagnosis not present

## 2018-08-28 DIAGNOSIS — D0511 Intraductal carcinoma in situ of right breast: Secondary | ICD-10-CM | POA: Diagnosis not present

## 2018-08-28 DIAGNOSIS — Z01818 Encounter for other preprocedural examination: Secondary | ICD-10-CM | POA: Diagnosis not present

## 2018-09-09 DIAGNOSIS — Z421 Encounter for breast reconstruction following mastectomy: Secondary | ICD-10-CM | POA: Diagnosis not present

## 2018-09-09 DIAGNOSIS — I10 Essential (primary) hypertension: Secondary | ICD-10-CM | POA: Diagnosis not present

## 2018-09-09 DIAGNOSIS — E039 Hypothyroidism, unspecified: Secondary | ICD-10-CM | POA: Diagnosis not present

## 2018-09-09 DIAGNOSIS — Z9011 Acquired absence of right breast and nipple: Secondary | ICD-10-CM | POA: Diagnosis not present

## 2018-09-09 DIAGNOSIS — Z803 Family history of malignant neoplasm of breast: Secondary | ICD-10-CM | POA: Diagnosis not present

## 2018-09-09 DIAGNOSIS — Z171 Estrogen receptor negative status [ER-]: Secondary | ICD-10-CM | POA: Diagnosis not present

## 2018-09-09 DIAGNOSIS — D0511 Intraductal carcinoma in situ of right breast: Secondary | ICD-10-CM | POA: Diagnosis not present

## 2018-09-09 DIAGNOSIS — J45909 Unspecified asthma, uncomplicated: Secondary | ICD-10-CM | POA: Diagnosis not present

## 2018-09-09 DIAGNOSIS — Z87891 Personal history of nicotine dependence: Secondary | ICD-10-CM | POA: Diagnosis not present

## 2018-09-11 NOTE — Progress Notes (Signed)
No show

## 2018-09-12 ENCOUNTER — Encounter: Payer: Self-pay | Admitting: Oncology

## 2018-09-12 ENCOUNTER — Inpatient Hospital Stay: Payer: BLUE CROSS/BLUE SHIELD | Attending: Oncology | Admitting: Oncology

## 2018-09-13 ENCOUNTER — Telehealth: Payer: Self-pay | Admitting: Oncology

## 2018-09-13 NOTE — Telephone Encounter (Signed)
Appt scheduled LMVM with date/time per 10/18 sch msg

## 2018-09-25 DIAGNOSIS — D0511 Intraductal carcinoma in situ of right breast: Secondary | ICD-10-CM | POA: Diagnosis not present

## 2018-10-02 DIAGNOSIS — D0511 Intraductal carcinoma in situ of right breast: Secondary | ICD-10-CM | POA: Diagnosis not present

## 2018-10-16 DIAGNOSIS — D0511 Intraductal carcinoma in situ of right breast: Secondary | ICD-10-CM | POA: Diagnosis not present

## 2018-10-16 DIAGNOSIS — Z9889 Other specified postprocedural states: Secondary | ICD-10-CM | POA: Diagnosis not present

## 2018-10-17 DIAGNOSIS — D0511 Intraductal carcinoma in situ of right breast: Secondary | ICD-10-CM | POA: Diagnosis not present

## 2018-10-17 DIAGNOSIS — Z808 Family history of malignant neoplasm of other organs or systems: Secondary | ICD-10-CM | POA: Diagnosis not present

## 2018-10-17 DIAGNOSIS — Z1239 Encounter for other screening for malignant neoplasm of breast: Secondary | ICD-10-CM | POA: Diagnosis not present

## 2018-10-17 DIAGNOSIS — Z9889 Other specified postprocedural states: Secondary | ICD-10-CM | POA: Diagnosis not present

## 2018-10-17 DIAGNOSIS — Z1379 Encounter for other screening for genetic and chromosomal anomalies: Secondary | ICD-10-CM | POA: Diagnosis not present

## 2018-11-10 NOTE — Progress Notes (Signed)
No show  Patient is receiving care through Dr. Harden Mo at [facility]

## 2018-11-11 ENCOUNTER — Inpatient Hospital Stay: Payer: BLUE CROSS/BLUE SHIELD | Attending: Oncology | Admitting: Oncology

## 2019-01-01 DIAGNOSIS — Z9889 Other specified postprocedural states: Secondary | ICD-10-CM | POA: Diagnosis not present

## 2019-01-01 DIAGNOSIS — D0511 Intraductal carcinoma in situ of right breast: Secondary | ICD-10-CM | POA: Diagnosis not present

## 2019-01-22 DIAGNOSIS — Z0000 Encounter for general adult medical examination without abnormal findings: Secondary | ICD-10-CM | POA: Diagnosis not present

## 2019-01-22 DIAGNOSIS — E039 Hypothyroidism, unspecified: Secondary | ICD-10-CM | POA: Diagnosis not present

## 2019-01-22 DIAGNOSIS — I10 Essential (primary) hypertension: Secondary | ICD-10-CM | POA: Diagnosis not present

## 2019-01-22 DIAGNOSIS — J309 Allergic rhinitis, unspecified: Secondary | ICD-10-CM | POA: Diagnosis not present

## 2019-01-22 DIAGNOSIS — Z1389 Encounter for screening for other disorder: Secondary | ICD-10-CM | POA: Diagnosis not present

## 2019-01-22 DIAGNOSIS — E559 Vitamin D deficiency, unspecified: Secondary | ICD-10-CM | POA: Diagnosis not present

## 2019-01-23 DIAGNOSIS — Z1231 Encounter for screening mammogram for malignant neoplasm of breast: Secondary | ICD-10-CM | POA: Diagnosis not present

## 2019-01-23 DIAGNOSIS — Z1239 Encounter for other screening for malignant neoplasm of breast: Secondary | ICD-10-CM | POA: Diagnosis not present

## 2019-01-23 DIAGNOSIS — Z9889 Other specified postprocedural states: Secondary | ICD-10-CM | POA: Diagnosis not present

## 2019-01-23 DIAGNOSIS — Z808 Family history of malignant neoplasm of other organs or systems: Secondary | ICD-10-CM | POA: Diagnosis not present

## 2019-01-23 DIAGNOSIS — D0511 Intraductal carcinoma in situ of right breast: Secondary | ICD-10-CM | POA: Diagnosis not present

## 2019-02-23 IMAGING — MG DIGITAL SCREENING BILATERAL MAMMOGRAM WITH TOMO AND CAD
8 series · 8 of 24 positions shown · non-contrast
Comparison: Previous exam(s).

CLINICAL DATA: Screening.

EXAM:
DIGITAL SCREENING BILATERAL MAMMOGRAM WITH TOMO AND CAD

[L CC synth-2D]
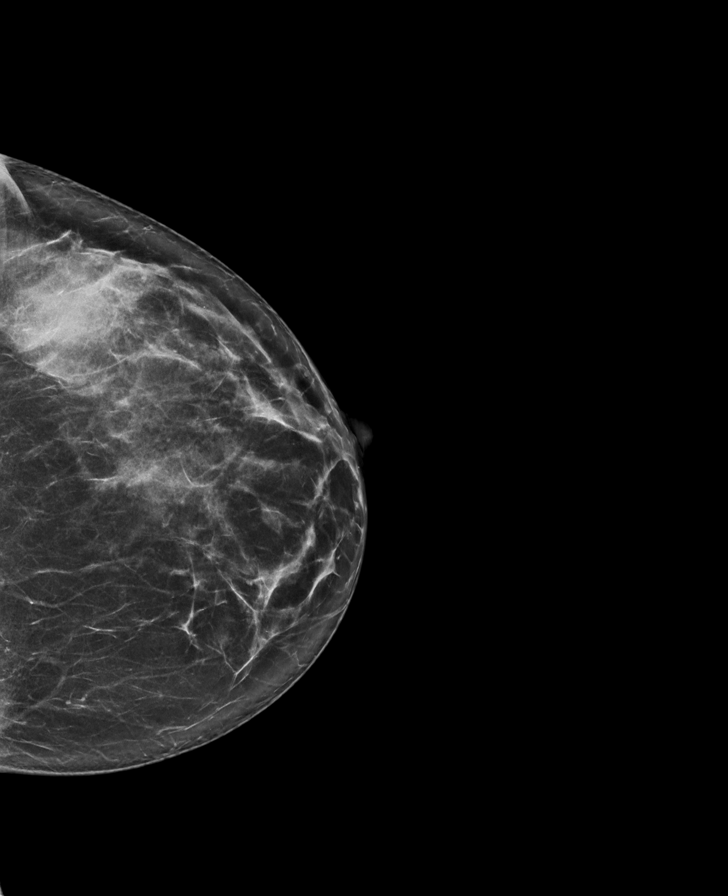

[R MLO synth-2D]
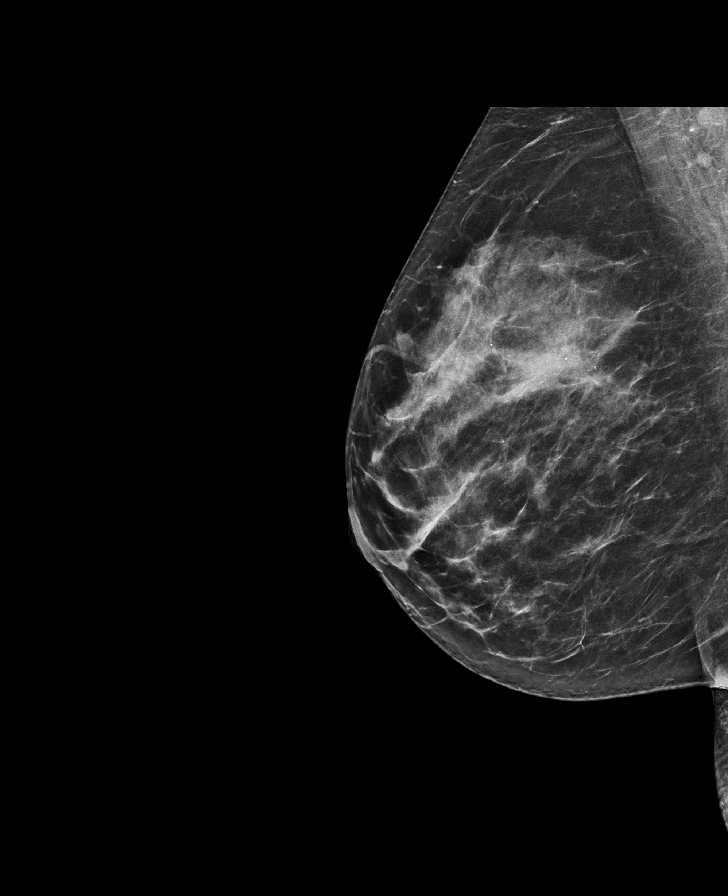

[L MLO synth-2D]
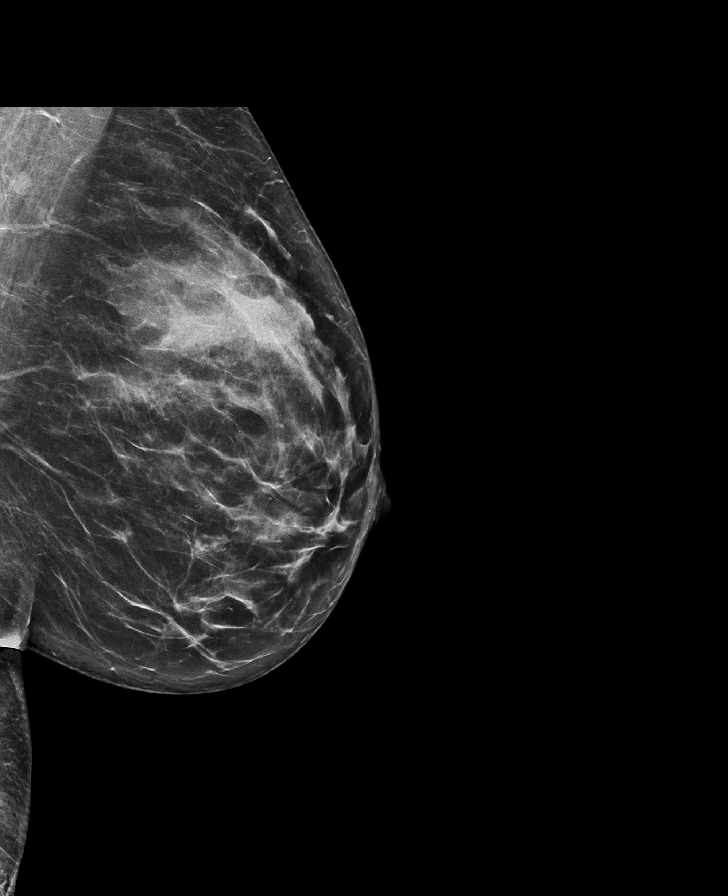

[R CC synth-2D]
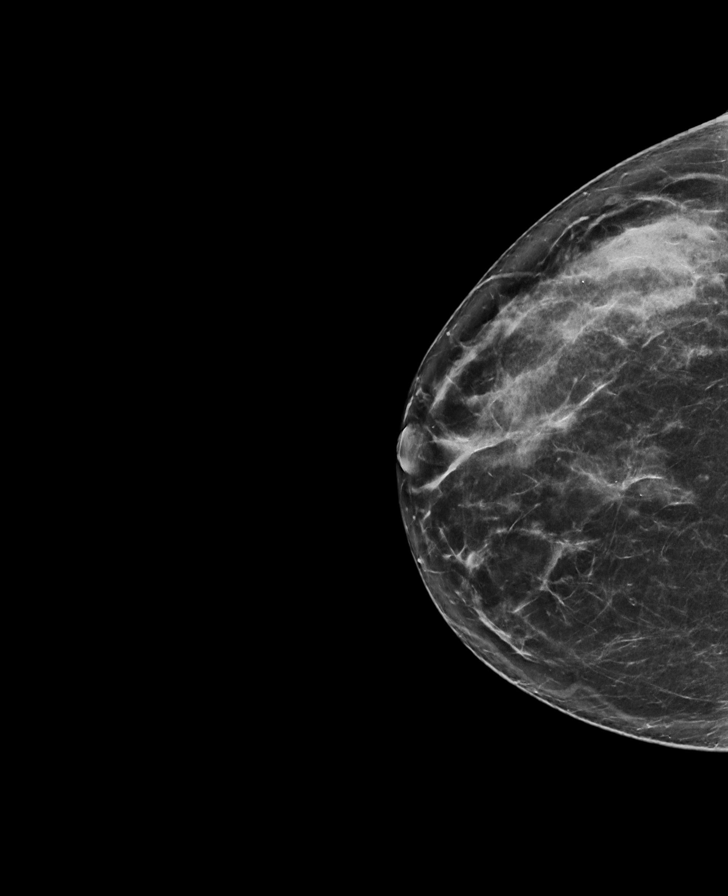

[L CC tomo · tomo slice 39/76.0]
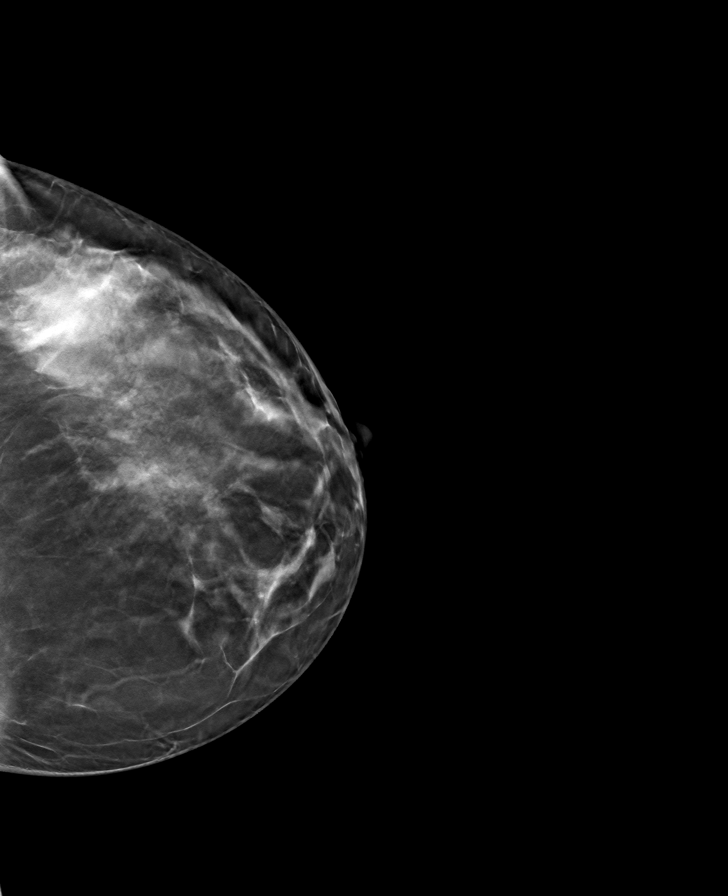

[R CC tomo · tomo slice 39/77.0]
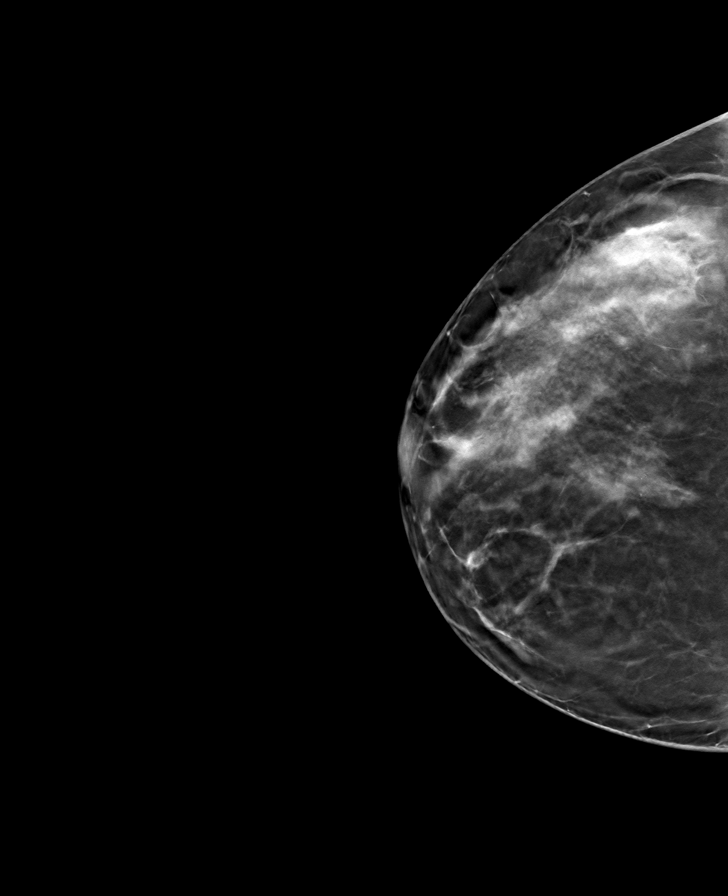

[R MLO tomo · tomo slice 38/75.0]
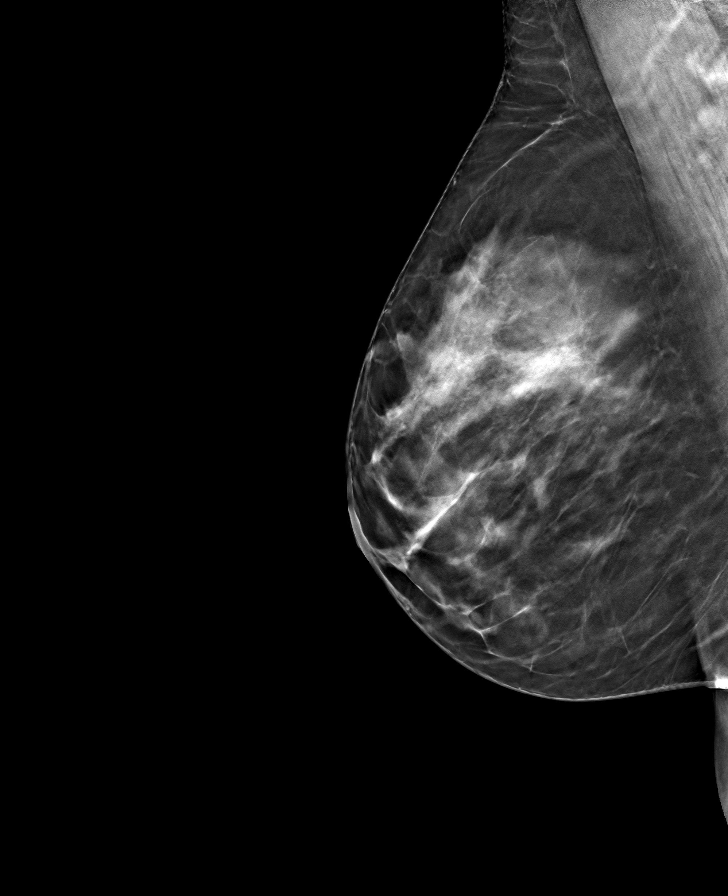

[L MLO tomo · tomo slice 39/78.0]
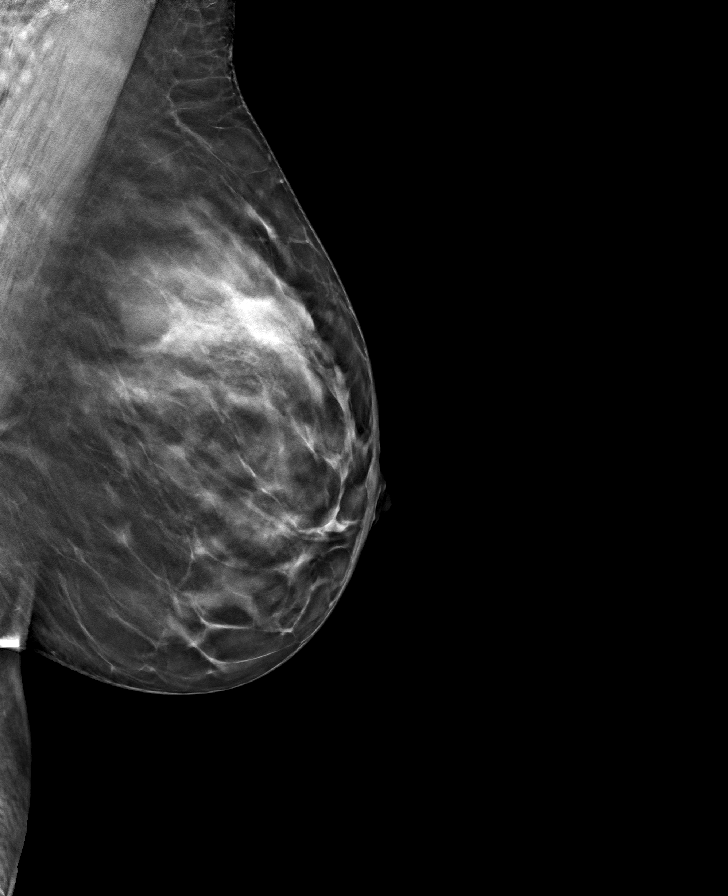

[8 of 24 positions shown; findings below may reference images not displayed]

ACR Breast Density Category c: The breast tissue is heterogeneously
dense, which may obscure small masses.
FINDINGS: In the right breast, calcifications warrant further evaluation with
magnified views. In the left breast, no findings suspicious for
malignancy. Images were processed with CAD.
IMPRESSION: Further evaluation is suggested for calcifications in the right
breast.

RECOMMENDATION:
Diagnostic mammogram of the right breast. (Code:[id number])

The patient will be contacted regarding the findings, and additional
imaging will be scheduled.

BI-RADS CATEGORY  0: Incomplete. Need additional imaging evaluation
and/or prior mammograms for comparison.

## 2019-04-02 DIAGNOSIS — D0511 Intraductal carcinoma in situ of right breast: Secondary | ICD-10-CM | POA: Diagnosis not present

## 2019-05-22 DIAGNOSIS — Z79810 Long term (current) use of selective estrogen receptor modulators (SERMs): Secondary | ICD-10-CM | POA: Diagnosis not present

## 2019-05-22 DIAGNOSIS — Z5181 Encounter for therapeutic drug level monitoring: Secondary | ICD-10-CM | POA: Diagnosis not present

## 2019-05-22 DIAGNOSIS — D0511 Intraductal carcinoma in situ of right breast: Secondary | ICD-10-CM | POA: Diagnosis not present

## 2019-12-07 DIAGNOSIS — Z20828 Contact with and (suspected) exposure to other viral communicable diseases: Secondary | ICD-10-CM | POA: Diagnosis not present

## 2019-12-17 DIAGNOSIS — Z20822 Contact with and (suspected) exposure to covid-19: Secondary | ICD-10-CM | POA: Diagnosis not present

## 2020-01-26 DIAGNOSIS — E039 Hypothyroidism, unspecified: Secondary | ICD-10-CM | POA: Diagnosis not present

## 2020-01-26 DIAGNOSIS — Z13220 Encounter for screening for lipoid disorders: Secondary | ICD-10-CM | POA: Diagnosis not present

## 2020-01-26 DIAGNOSIS — Z0000 Encounter for general adult medical examination without abnormal findings: Secondary | ICD-10-CM | POA: Diagnosis not present

## 2020-01-26 DIAGNOSIS — E559 Vitamin D deficiency, unspecified: Secondary | ICD-10-CM | POA: Diagnosis not present

## 2020-01-26 DIAGNOSIS — Z1389 Encounter for screening for other disorder: Secondary | ICD-10-CM | POA: Diagnosis not present

## 2020-01-29 DIAGNOSIS — Z9011 Acquired absence of right breast and nipple: Secondary | ICD-10-CM | POA: Diagnosis not present

## 2020-01-29 DIAGNOSIS — Z79810 Long term (current) use of selective estrogen receptor modulators (SERMs): Secondary | ICD-10-CM | POA: Diagnosis not present

## 2020-01-29 DIAGNOSIS — D0511 Intraductal carcinoma in situ of right breast: Secondary | ICD-10-CM | POA: Diagnosis not present

## 2020-01-29 DIAGNOSIS — R922 Inconclusive mammogram: Secondary | ICD-10-CM | POA: Diagnosis not present

## 2021-01-26 DIAGNOSIS — Z0000 Encounter for general adult medical examination without abnormal findings: Secondary | ICD-10-CM | POA: Diagnosis not present

## 2021-01-26 DIAGNOSIS — I10 Essential (primary) hypertension: Secondary | ICD-10-CM | POA: Diagnosis not present

## 2021-01-26 DIAGNOSIS — E559 Vitamin D deficiency, unspecified: Secondary | ICD-10-CM | POA: Diagnosis not present

## 2021-01-26 DIAGNOSIS — E039 Hypothyroidism, unspecified: Secondary | ICD-10-CM | POA: Diagnosis not present

## 2021-01-26 DIAGNOSIS — Z13220 Encounter for screening for lipoid disorders: Secondary | ICD-10-CM | POA: Diagnosis not present

## 2021-01-26 DIAGNOSIS — I7300 Raynaud's syndrome without gangrene: Secondary | ICD-10-CM | POA: Diagnosis not present

## 2021-01-31 DIAGNOSIS — D0511 Intraductal carcinoma in situ of right breast: Secondary | ICD-10-CM | POA: Diagnosis not present

## 2021-01-31 DIAGNOSIS — Z9011 Acquired absence of right breast and nipple: Secondary | ICD-10-CM | POA: Diagnosis not present

## 2021-01-31 DIAGNOSIS — R928 Other abnormal and inconclusive findings on diagnostic imaging of breast: Secondary | ICD-10-CM | POA: Diagnosis not present

## 2021-01-31 DIAGNOSIS — Z23 Encounter for immunization: Secondary | ICD-10-CM | POA: Diagnosis not present

## 2021-03-03 DIAGNOSIS — N641 Fat necrosis of breast: Secondary | ICD-10-CM | POA: Diagnosis not present

## 2021-03-03 DIAGNOSIS — D0511 Intraductal carcinoma in situ of right breast: Secondary | ICD-10-CM | POA: Diagnosis not present

## 2021-12-05 DIAGNOSIS — E039 Hypothyroidism, unspecified: Secondary | ICD-10-CM | POA: Diagnosis not present

## 2021-12-05 DIAGNOSIS — D259 Leiomyoma of uterus, unspecified: Secondary | ICD-10-CM | POA: Diagnosis not present

## 2021-12-05 DIAGNOSIS — I10 Essential (primary) hypertension: Secondary | ICD-10-CM | POA: Diagnosis not present

## 2021-12-05 DIAGNOSIS — Z01419 Encounter for gynecological examination (general) (routine) without abnormal findings: Secondary | ICD-10-CM | POA: Diagnosis not present

## 2021-12-05 DIAGNOSIS — C50919 Malignant neoplasm of unspecified site of unspecified female breast: Secondary | ICD-10-CM | POA: Diagnosis not present

## 2022-02-06 DIAGNOSIS — D0511 Intraductal carcinoma in situ of right breast: Secondary | ICD-10-CM | POA: Diagnosis not present

## 2022-02-06 DIAGNOSIS — Z9011 Acquired absence of right breast and nipple: Secondary | ICD-10-CM | POA: Diagnosis not present

## 2022-02-06 DIAGNOSIS — F418 Other specified anxiety disorders: Secondary | ICD-10-CM | POA: Diagnosis not present

## 2022-02-06 DIAGNOSIS — Z853 Personal history of malignant neoplasm of breast: Secondary | ICD-10-CM | POA: Diagnosis not present

## 2022-02-06 DIAGNOSIS — Z79810 Long term (current) use of selective estrogen receptor modulators (SERMs): Secondary | ICD-10-CM | POA: Diagnosis not present

## 2022-02-06 DIAGNOSIS — Z5181 Encounter for therapeutic drug level monitoring: Secondary | ICD-10-CM | POA: Diagnosis not present

## 2022-02-06 DIAGNOSIS — M25559 Pain in unspecified hip: Secondary | ICD-10-CM | POA: Diagnosis not present

## 2022-03-13 DIAGNOSIS — E039 Hypothyroidism, unspecified: Secondary | ICD-10-CM | POA: Diagnosis not present

## 2022-03-13 DIAGNOSIS — Z23 Encounter for immunization: Secondary | ICD-10-CM | POA: Diagnosis not present

## 2022-03-13 DIAGNOSIS — E559 Vitamin D deficiency, unspecified: Secondary | ICD-10-CM | POA: Diagnosis not present

## 2022-03-13 DIAGNOSIS — I10 Essential (primary) hypertension: Secondary | ICD-10-CM | POA: Diagnosis not present

## 2022-03-13 DIAGNOSIS — Z0000 Encounter for general adult medical examination without abnormal findings: Secondary | ICD-10-CM | POA: Diagnosis not present

## 2022-03-23 DIAGNOSIS — Z853 Personal history of malignant neoplasm of breast: Secondary | ICD-10-CM | POA: Diagnosis not present

## 2022-03-23 DIAGNOSIS — Z13220 Encounter for screening for lipoid disorders: Secondary | ICD-10-CM | POA: Diagnosis not present

## 2022-03-23 DIAGNOSIS — Z1389 Encounter for screening for other disorder: Secondary | ICD-10-CM | POA: Diagnosis not present

## 2022-03-23 DIAGNOSIS — E559 Vitamin D deficiency, unspecified: Secondary | ICD-10-CM | POA: Diagnosis not present

## 2022-03-23 DIAGNOSIS — I10 Essential (primary) hypertension: Secondary | ICD-10-CM | POA: Diagnosis not present

## 2022-05-24 DIAGNOSIS — E039 Hypothyroidism, unspecified: Secondary | ICD-10-CM | POA: Diagnosis not present

## 2022-08-22 DIAGNOSIS — Z23 Encounter for immunization: Secondary | ICD-10-CM | POA: Diagnosis not present

## 2023-02-08 DIAGNOSIS — Z9011 Acquired absence of right breast and nipple: Secondary | ICD-10-CM | POA: Diagnosis not present

## 2023-02-08 DIAGNOSIS — Z853 Personal history of malignant neoplasm of breast: Secondary | ICD-10-CM | POA: Diagnosis not present

## 2023-02-08 DIAGNOSIS — D0511 Intraductal carcinoma in situ of right breast: Secondary | ICD-10-CM | POA: Diagnosis not present

## 2023-02-08 DIAGNOSIS — Z79810 Long term (current) use of selective estrogen receptor modulators (SERMs): Secondary | ICD-10-CM | POA: Diagnosis not present

## 2023-02-08 DIAGNOSIS — Z5181 Encounter for therapeutic drug level monitoring: Secondary | ICD-10-CM | POA: Diagnosis not present

## 2023-02-08 DIAGNOSIS — R92332 Mammographic heterogeneous density, left breast: Secondary | ICD-10-CM | POA: Diagnosis not present

## 2023-03-15 DIAGNOSIS — Z13220 Encounter for screening for lipoid disorders: Secondary | ICD-10-CM | POA: Diagnosis not present

## 2023-03-15 DIAGNOSIS — Z0000 Encounter for general adult medical examination without abnormal findings: Secondary | ICD-10-CM | POA: Diagnosis not present

## 2023-03-15 DIAGNOSIS — E039 Hypothyroidism, unspecified: Secondary | ICD-10-CM | POA: Diagnosis not present

## 2023-03-15 DIAGNOSIS — I10 Essential (primary) hypertension: Secondary | ICD-10-CM | POA: Diagnosis not present

## 2023-03-15 DIAGNOSIS — E559 Vitamin D deficiency, unspecified: Secondary | ICD-10-CM | POA: Diagnosis not present

## 2023-05-10 DIAGNOSIS — N6310 Unspecified lump in the right breast, unspecified quadrant: Secondary | ICD-10-CM | POA: Diagnosis not present

## 2023-05-10 DIAGNOSIS — Z9012 Acquired absence of left breast and nipple: Secondary | ICD-10-CM | POA: Diagnosis not present

## 2023-05-10 DIAGNOSIS — N6311 Unspecified lump in the right breast, upper outer quadrant: Secondary | ICD-10-CM | POA: Diagnosis not present

## 2023-05-10 DIAGNOSIS — D0511 Intraductal carcinoma in situ of right breast: Secondary | ICD-10-CM | POA: Diagnosis not present

## 2024-02-11 DIAGNOSIS — Z09 Encounter for follow-up examination after completed treatment for conditions other than malignant neoplasm: Secondary | ICD-10-CM | POA: Diagnosis not present

## 2024-02-11 DIAGNOSIS — Z5181 Encounter for therapeutic drug level monitoring: Secondary | ICD-10-CM | POA: Diagnosis not present

## 2024-02-11 DIAGNOSIS — Z23 Encounter for immunization: Secondary | ICD-10-CM | POA: Diagnosis not present

## 2024-02-11 DIAGNOSIS — Z86000 Personal history of in-situ neoplasm of breast: Secondary | ICD-10-CM | POA: Diagnosis not present

## 2024-02-11 DIAGNOSIS — D0511 Intraductal carcinoma in situ of right breast: Secondary | ICD-10-CM | POA: Diagnosis not present

## 2024-02-11 DIAGNOSIS — Z1239 Encounter for other screening for malignant neoplasm of breast: Secondary | ICD-10-CM | POA: Diagnosis not present

## 2024-02-11 DIAGNOSIS — Z79810 Long term (current) use of selective estrogen receptor modulators (SERMs): Secondary | ICD-10-CM | POA: Diagnosis not present

## 2024-05-13 DIAGNOSIS — D649 Anemia, unspecified: Secondary | ICD-10-CM | POA: Diagnosis not present

## 2024-05-13 DIAGNOSIS — I10 Essential (primary) hypertension: Secondary | ICD-10-CM | POA: Diagnosis not present

## 2024-05-13 DIAGNOSIS — I7300 Raynaud's syndrome without gangrene: Secondary | ICD-10-CM | POA: Diagnosis not present

## 2024-05-13 DIAGNOSIS — E559 Vitamin D deficiency, unspecified: Secondary | ICD-10-CM | POA: Diagnosis not present

## 2024-05-13 DIAGNOSIS — E039 Hypothyroidism, unspecified: Secondary | ICD-10-CM | POA: Diagnosis not present

## 2024-05-13 DIAGNOSIS — Z0000 Encounter for general adult medical examination without abnormal findings: Secondary | ICD-10-CM | POA: Diagnosis not present

## 2024-05-13 DIAGNOSIS — D509 Iron deficiency anemia, unspecified: Secondary | ICD-10-CM | POA: Diagnosis not present

## 2024-06-16 DIAGNOSIS — D649 Anemia, unspecified: Secondary | ICD-10-CM | POA: Diagnosis not present

## 2024-06-16 DIAGNOSIS — E039 Hypothyroidism, unspecified: Secondary | ICD-10-CM | POA: Diagnosis not present

## 2024-07-14 DIAGNOSIS — Z1211 Encounter for screening for malignant neoplasm of colon: Secondary | ICD-10-CM | POA: Diagnosis not present

## 2024-07-14 DIAGNOSIS — D128 Benign neoplasm of rectum: Secondary | ICD-10-CM | POA: Diagnosis not present

## 2024-07-14 DIAGNOSIS — K635 Polyp of colon: Secondary | ICD-10-CM | POA: Diagnosis not present

## 2024-08-27 DIAGNOSIS — D649 Anemia, unspecified: Secondary | ICD-10-CM | POA: Diagnosis not present

## 2024-10-14 ENCOUNTER — Ambulatory Visit: Admitting: Dermatology

## 2024-10-14 ENCOUNTER — Encounter: Payer: Self-pay | Admitting: Dermatology

## 2024-10-14 VITALS — BP 135/84 | HR 74

## 2024-10-14 DIAGNOSIS — R229 Localized swelling, mass and lump, unspecified: Secondary | ICD-10-CM

## 2024-10-14 DIAGNOSIS — R220 Localized swelling, mass and lump, head: Secondary | ICD-10-CM | POA: Diagnosis not present

## 2024-10-14 NOTE — Patient Instructions (Signed)

## 2024-10-14 NOTE — Progress Notes (Unsigned)
   New Patient Visit   Subjective  Crystal Harrison is a 53 y.o. female who presents for the following: growth  Pt had a spot that was on her right cheek for a couple months but now resolved. She shows photos on her phone of the lesion having grown in size over the course of a few weeks, dating back to August 2025. Lesion appears resolved today.   The following portions of the chart were reviewed this encounter and updated as appropriate: medications, allergies, medical history  Review of Systems:  No other skin or systemic complaints except as noted in HPI or Assessment and Plan.  Objective  Well appearing patient in no apparent distress; mood and affect are within normal limits.  A focused examination was performed of the following areas: face  Relevant exam findings are noted in the Assessment and Plan.    Assessment & Plan   Subcutaneous Nodule- Right cheek- Ddx EIC vs Lipoma vs other Exam: Subcutaneous nodule at right cheek--resolved today  Benign-appearing. Exam most consistent with an epidermal inclusion cyst. Discussed that a cyst is a benign growth that can grow over time and sometimes get irritated or inflamed. Recommend observation if it is not bothersome. Discussed option of surgical excision to remove it if it is growing, symptomatic, or other changes noted. Please call for new or changing lesions so they can be evaluated.      Return for TBSE with brenda, hair thinning with dr alm.  I, Darice Smock, CMA, am acting as scribe for RUFUS CHRISTELLA HOLY, MD.   Documentation: I have reviewed the above documentation for accuracy and completeness, and I agree with the above.  RUFUS CHRISTELLA HOLY, MD

## 2024-12-15 ENCOUNTER — Ambulatory Visit: Admitting: Physician Assistant

## 2025-04-27 ENCOUNTER — Ambulatory Visit: Admitting: Dermatology
